# Patient Record
Sex: Male | Born: 1970
Health system: Southern US, Community
[De-identification: ages and names within clinical notes are randomized; demographics above are authoritative.]

## PROBLEM LIST (undated history)

## (undated) DIAGNOSIS — F419 Anxiety disorder, unspecified: Secondary | ICD-10-CM

## (undated) DIAGNOSIS — F32A Depression, unspecified: Secondary | ICD-10-CM

## (undated) DIAGNOSIS — F329 Major depressive disorder, single episode, unspecified: Secondary | ICD-10-CM

## (undated) DIAGNOSIS — E119 Type 2 diabetes mellitus without complications: Secondary | ICD-10-CM

## (undated) HISTORY — PX: NO PAST SURGERIES: SHX2092

---

## 2001-05-02 ENCOUNTER — Emergency Department (HOSPITAL_COMMUNITY): Admission: EM | Admit: 2001-05-02 | Discharge: 2001-05-02 | Payer: Self-pay | Admitting: Emergency Medicine

## 2002-03-24 ENCOUNTER — Emergency Department (HOSPITAL_COMMUNITY): Admission: EM | Admit: 2002-03-24 | Discharge: 2002-03-24 | Payer: Self-pay | Admitting: Emergency Medicine

## 2002-08-11 ENCOUNTER — Emergency Department (HOSPITAL_COMMUNITY): Admission: EM | Admit: 2002-08-11 | Discharge: 2002-08-11 | Payer: Self-pay | Admitting: Emergency Medicine

## 2002-08-14 ENCOUNTER — Emergency Department (HOSPITAL_COMMUNITY): Admission: EM | Admit: 2002-08-14 | Discharge: 2002-08-14 | Payer: Self-pay | Admitting: Emergency Medicine

## 2002-09-06 ENCOUNTER — Emergency Department (HOSPITAL_COMMUNITY): Admission: EM | Admit: 2002-09-06 | Discharge: 2002-09-06 | Payer: Self-pay | Admitting: Emergency Medicine

## 2002-09-06 ENCOUNTER — Emergency Department (HOSPITAL_COMMUNITY): Admission: EM | Admit: 2002-09-06 | Discharge: 2002-09-06 | Payer: Self-pay

## 2002-09-06 ENCOUNTER — Encounter: Payer: Self-pay | Admitting: Emergency Medicine

## 2002-09-15 ENCOUNTER — Emergency Department (HOSPITAL_COMMUNITY): Admission: EM | Admit: 2002-09-15 | Discharge: 2002-09-15 | Payer: Self-pay | Admitting: Emergency Medicine

## 2002-10-21 ENCOUNTER — Emergency Department (HOSPITAL_COMMUNITY): Admission: EM | Admit: 2002-10-21 | Discharge: 2002-10-21 | Payer: Self-pay | Admitting: Emergency Medicine

## 2002-11-21 ENCOUNTER — Emergency Department (HOSPITAL_COMMUNITY): Admission: EM | Admit: 2002-11-21 | Discharge: 2002-11-21 | Payer: Self-pay | Admitting: Emergency Medicine

## 2002-11-24 ENCOUNTER — Emergency Department (HOSPITAL_COMMUNITY): Admission: EM | Admit: 2002-11-24 | Discharge: 2002-11-24 | Payer: Self-pay | Admitting: Emergency Medicine

## 2002-11-26 ENCOUNTER — Emergency Department (HOSPITAL_COMMUNITY): Admission: EM | Admit: 2002-11-26 | Discharge: 2002-11-26 | Payer: Self-pay | Admitting: Emergency Medicine

## 2004-07-03 ENCOUNTER — Emergency Department (HOSPITAL_COMMUNITY): Admission: EM | Admit: 2004-07-03 | Discharge: 2004-07-03 | Payer: Self-pay | Admitting: Family Medicine

## 2007-01-27 ENCOUNTER — Emergency Department (HOSPITAL_COMMUNITY): Admission: EM | Admit: 2007-01-27 | Discharge: 2007-01-27 | Payer: Self-pay | Admitting: Family Medicine

## 2007-03-17 ENCOUNTER — Emergency Department (HOSPITAL_COMMUNITY): Admission: EM | Admit: 2007-03-17 | Discharge: 2007-03-17 | Payer: Self-pay | Admitting: Emergency Medicine

## 2007-03-18 ENCOUNTER — Emergency Department (HOSPITAL_COMMUNITY): Admission: EM | Admit: 2007-03-18 | Discharge: 2007-03-18 | Payer: Self-pay | Admitting: Emergency Medicine

## 2008-03-31 ENCOUNTER — Emergency Department (HOSPITAL_COMMUNITY): Admission: EM | Admit: 2008-03-31 | Discharge: 2008-03-31 | Payer: Self-pay | Admitting: Emergency Medicine

## 2010-09-24 ENCOUNTER — Inpatient Hospital Stay (INDEPENDENT_AMBULATORY_CARE_PROVIDER_SITE_OTHER)
Admission: RE | Admit: 2010-09-24 | Discharge: 2010-09-24 | Disposition: A | Discharge status: Home / Self Care | Payer: Self-pay | Source: Ambulatory Visit | Attending: Family Medicine | Admitting: Family Medicine

## 2010-09-24 DIAGNOSIS — K029 Dental caries, unspecified: Secondary | ICD-10-CM

## 2010-09-24 DIAGNOSIS — K089 Disorder of teeth and supporting structures, unspecified: Secondary | ICD-10-CM

## 2013-08-10 ENCOUNTER — Encounter (HOSPITAL_COMMUNITY): Payer: Self-pay | Admitting: Emergency Medicine

## 2013-08-10 ENCOUNTER — Emergency Department (HOSPITAL_COMMUNITY)
Admission: EM | Admit: 2013-08-10 | Discharge: 2013-08-10 | Disposition: A | Discharge status: Home / Self Care | Payer: BC Managed Care – PPO | Attending: Emergency Medicine | Admitting: Emergency Medicine

## 2013-08-10 DIAGNOSIS — R42 Dizziness and giddiness: Secondary | ICD-10-CM | POA: Insufficient documentation

## 2013-08-10 DIAGNOSIS — Z760 Encounter for issue of repeat prescription: Secondary | ICD-10-CM | POA: Insufficient documentation

## 2013-08-10 DIAGNOSIS — F411 Generalized anxiety disorder: Secondary | ICD-10-CM | POA: Insufficient documentation

## 2013-08-10 DIAGNOSIS — Z79899 Other long term (current) drug therapy: Secondary | ICD-10-CM | POA: Insufficient documentation

## 2013-08-10 DIAGNOSIS — F419 Anxiety disorder, unspecified: Secondary | ICD-10-CM

## 2013-08-10 DIAGNOSIS — R030 Elevated blood-pressure reading, without diagnosis of hypertension: Secondary | ICD-10-CM | POA: Insufficient documentation

## 2013-08-10 HISTORY — DX: Anxiety disorder, unspecified: F41.9

## 2013-08-10 MED ORDER — CLONAZEPAM 0.5 MG PO TABS: 0.5000 mg | ORAL_TABLET | Freq: Every day | ORAL | Status: DC

## 2013-08-10 NOTE — Discharge Instructions (Signed)
Take clonazepam as prescribed.  Do not drive within four hours of taking this medication (may cause drowsiness or confusion).   Follow up with your doctor as scheduled.  Return to the ER if there is a change from your typical symptoms or you develop worsening chest pain.

## 2013-08-10 NOTE — ED Notes (Signed)
PT ambulated with baseline gait; VSS; A&Ox3; no signs of distress; respirations even and unlabored; skin warm and dry; no questions upon discharge.  

## 2013-08-10 NOTE — ED Notes (Signed)
Pt in stating he had an anxiety attack tonight, states he has been out of his clonazepam 0.5mg  for about a month, he would like to get a refill of his medications since he is experiencing symptoms, states his appointment with his MD is 2/28.

## 2013-08-10 NOTE — ED Provider Notes (Signed)
CSN: [id number]     Arrival date & time 08/10/13  2100 History   First MD Initiated Contact with Patient 08/10/13 2114     Chief Complaint  Patient presents with  . Medication Refill     (Consider location/radiation/quality/duration/timing/severity/associated sxs/prior Treatment) HPI History provided by pt.    Pt requests a refill of his clonazepam.  This medication is prescribed regularly by his PCP, but he ran out about a month ago and next appointment on 08/18/13.  He experienced a panic attack at ~7pm today.  Sx included lightheadedness, chest tightness and bilateral hand tingling, all of which are typical.  He has had persistent lightheadedness since onset, but has otherwise been feeling well.  Has been drinking fluids and eating regularly.  No new medications.  Has otherwise not had any CP/SOB.  Denies hematochezia/melena or bleeding at any other site.  No RF for PE.  No PMH.  Past Medical History  Diagnosis Date  . Anxiety    History reviewed. No pertinent past surgical history. History reviewed. No pertinent family history. History  Substance Use Topics  . Smoking status: Never Smoker   . Smokeless tobacco: Not on file  . Alcohol Use: Not on file    Review of Systems  All other systems reviewed and are negative.      Allergies  Review of patient's allergies indicates no known allergies.  Home Medications   Current Outpatient Rx  Name  Route  Sig  Dispense  Refill  . clonazePAM (KLONOPIN) 0.5 MG tablet   Oral   Take 0.5 mg by mouth daily as needed for anxiety.         Marland Kitchen. venlafaxine (EFFEXOR) 75 MG tablet   Oral   Take 75 mg by mouth daily.         . clonazePAM (KLONOPIN) 0.5 MG tablet   Oral   Take 1 tablet (0.5 mg total) by mouth daily.   8 tablet   0    BP 155/92  Pulse 82  Temp(Src) 98.4 F (36.9 C) (Oral)  Resp 18  Ht 5\' 9"  (1.753 m)  Wt 200 lb (90.719 kg)  BMI 29.52 kg/m2  SpO2 99% Physical Exam  Nursing note and vitals  reviewed. Constitutional: He is oriented to person, place, and time. He appears well-developed and well-nourished. No distress.  HENT:  Head: Normocephalic and atraumatic.  Mouth/Throat: Oropharynx is clear and moist.  Eyes: Conjunctivae are normal.  Normal appearance  Neck: Normal range of motion.  Cardiovascular: Normal rate, regular rhythm and intact distal pulses.   Pulmonary/Chest: Effort normal and breath sounds normal. No respiratory distress.  Musculoskeletal: Normal range of motion.  No calf edema/ttp  Neurological: He is alert and oriented to person, place, and time.  Appears anxious  Skin: Skin is warm and dry. No rash noted.  Psychiatric: He has a normal mood and affect. His behavior is normal.    ED Course  Procedures (including critical care time) Labs Review Labs Reviewed - No data to display Imaging Review No results found.  EKG Interpretation   None       MDM   Final diagnoses:  Anxiety    43yo M presents w/ typical anxiety attack since 7pm today.  Sx improved but has persistent lightheadedness.  Ran out of his clonazepam 1 month ago and f/u scheduled w/ PCP on 08/18/13.  He requests refill of his medication.  Appears mildly anxious but exam otherwise unremarkable; slightly hypertensive, nml HR, no sign of  anemia, dehydration, DVT.  Per [place] Cont[place] Substance [place], pt is prescribed this medication by his PCP on a monthly basis, most recently in 05/2013.  I prescribed 8 tabs to get him through until his appointment. He is aware that his BP is elevated and he will notify his PCP. Return precautions discussed.    Otilio Miu, PA-C 08/10/13 2217

## 2013-08-11 NOTE — ED Provider Notes (Signed)
Medical screening examination/treatment/procedure(s) were performed by non-physician practitioner and as supervising physician I was immediately available for consultation/collaboration.  EKG Interpretation   None         Dagmar HaitWilliam Tinita Brooker, MD 08/11/13 2356

## 2013-10-29 ENCOUNTER — Emergency Department (HOSPITAL_COMMUNITY)
Admission: EM | Admit: 2013-10-29 | Discharge: 2013-10-29 | Disposition: A | Discharge status: Home / Self Care | Payer: BC Managed Care – PPO | Attending: Emergency Medicine | Admitting: Emergency Medicine

## 2013-10-29 ENCOUNTER — Emergency Department (HOSPITAL_COMMUNITY): Payer: BC Managed Care – PPO

## 2013-10-29 ENCOUNTER — Encounter (HOSPITAL_COMMUNITY): Payer: Self-pay | Admitting: Emergency Medicine

## 2013-10-29 DIAGNOSIS — F411 Generalized anxiety disorder: Secondary | ICD-10-CM | POA: Insufficient documentation

## 2013-10-29 DIAGNOSIS — Y92410 Unspecified street and highway as the place of occurrence of the external cause: Secondary | ICD-10-CM | POA: Insufficient documentation

## 2013-10-29 DIAGNOSIS — IMO0002 Reserved for concepts with insufficient information to code with codable children: Secondary | ICD-10-CM | POA: Insufficient documentation

## 2013-10-29 DIAGNOSIS — M2391 Unspecified internal derangement of right knee: Secondary | ICD-10-CM

## 2013-10-29 DIAGNOSIS — Y9389 Activity, other specified: Secondary | ICD-10-CM | POA: Insufficient documentation

## 2013-10-29 DIAGNOSIS — Z79899 Other long term (current) drug therapy: Secondary | ICD-10-CM | POA: Insufficient documentation

## 2013-10-29 MED ORDER — HYDROCODONE-ACETAMINOPHEN 5-325 MG PO TABS: 1.0000 | ORAL_TABLET | Freq: Once | ORAL | Status: DC

## 2013-10-29 MED ORDER — HYDROCODONE-ACETAMINOPHEN 5-325 MG PO TABS: 1.0000 | ORAL_TABLET | ORAL | Status: DC | PRN

## 2013-10-29 MED ORDER — NAPROXEN 500 MG PO TABS: 500.0000 mg | ORAL_TABLET | Freq: Two times a day (BID) | ORAL | Status: DC

## 2013-10-29 MED ORDER — HYDROCODONE-ACETAMINOPHEN 5-325 MG PO TABS
1.0000 | ORAL_TABLET | Freq: Once | ORAL | Status: AC
  Administered 2013-10-29: 1 via ORAL
  Filled 2013-10-29: qty 1
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
## 2013-10-29 NOTE — ED Notes (Signed)
Patient transported to X-ray 
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
## 2013-10-29 NOTE — Discharge Instructions (Signed)
Knee Effusion  Knee effusion means you have fluid in your knee. The knee may be more difficult to bend and move. HOME CARE  Use crutches or a brace as told by your doctor.  Put ice on the injured area.  Put ice in a plastic bag.  Place a towel between your skin and the bag.  Leave the ice on for 15-20 minutes, 03-04 times a day.  Raise (elevate) your knee as much as possible.  Only take medicine as told by your doctor.  You may need to do strengthening exercises. Ask your doctor.  Continue with your normal diet and activities as told by your doctor. GET HELP RIGHT AWAY IF:  You have more puffiness (swelling) in your knee.  You see redness, puffiness, or have more pain in your knee.  You have a temperature by mouth above 102 F (38.9 C).  You get a rash.  You have trouble breathing.  You have a reaction to any medicine you are taking.  You have a lot of pain when you move your knee. MAKE SURE YOU:  Understand these instructions.  Will watch your condition.  Will get help right away if you are not doing well or get worse. Document Released: 07/10/2010 Document Revised: 08/30/2011 Document Reviewed: 07/10/2010 Exit[employer] Patient Information 2014 [employer].  

## 2013-11-21 ENCOUNTER — Other Ambulatory Visit (HOSPITAL_COMMUNITY): Payer: Self-pay | Admitting: Orthopedic Surgery

## 2013-11-28 ENCOUNTER — Other Ambulatory Visit (HOSPITAL_COMMUNITY): Payer: Self-pay | Admitting: *Deleted

## 2013-11-28 NOTE — Pre-Procedure Instructions (Signed)
Joshua [name]  11/28/2013   Your procedure is scheduled on: Tuesday, December 04, 2013 at 10:39 AM.    Report to [facility] Entrance "A" Admitting Office at 8:40 AM.   Call this number if you have problems the morning of surgery: [phone]   Remember:   Do not eat food or drink liquids after midnight Monday, 12/03/13.   Take these medicines the morning of surgery with A SIP OF WATER: Hydrocodone - if needed, Hydroxyzine - if needed.  Do not use any Aspirin or NSAIDS (Ibuprofen, Aleve, etc) as of today.   Do not wear jewelry.  Do not wear lotions, powders, or cologne. You may wear deodorant.  Do not shave 48 hours prior to surgery. Men may shave face and neck.  Do not bring valuables to the hospital.  [facility] is not responsible                  for any belongings or valuables.               Contacts, dentures or bridgework may not be worn into surgery.  Leave suitcase in the car. After surgery it may be brought to your room.  For patients admitted to the hospital, discharge time is determined by your                treatment team.                               Special Instructions: [place] - Preparing for Surgery  Before surgery, you can play an important role.  Because skin is not sterile, your skin needs to be as free of germs as possible.  You can reduce the number of germs on you skin by washing with CHG (chlorahexidine gluconate) soap before surgery.  CHG is an antiseptic cleaner which kills germs and bonds with the skin to continue killing germs even after washing.  Please DO NOT use if you have an allergy to CHG or antibacterial soaps.  If your skin becomes reddened/irritated stop using the CHG and inform your nurse when you arrive at Short Stay.  Do not shave (including legs and underarms) for at least 48 hours prior to the first CHG shower.  You may shave your face.  Please follow these instructions carefully:   1.  Shower with CHG Soap the night before  surgery and the                                morning of Surgery.  2.  If you choose to wash your hair, wash your hair first as usual with your       normal shampoo.  3.  After you shampoo, rinse your hair and body thoroughly to remove the                      Shampoo.  4.  Use CHG as you would any other liquid soap.  You can apply chg directly       to the skin and wash gently with scrungie or a clean washcloth.  5.  Apply the CHG Soap to your body ONLY FROM THE NECK DOWN.        Do not use on open wounds or open sores.  Avoid contact with your eyes, ears, mouth and genitals (private parts).  Wash genitals (private parts) with your normal soap.  6.  Wash thoroughly, paying special attention to the area where your surgery        will be performed.  7.  Thoroughly rinse your body with warm water from the neck down.  8.  DO NOT shower/wash with your normal soap after using and rinsing off       the CHG Soap.  9.  Pat yourself dry with a clean towel.            10.  Wear clean pajamas.            11.  Place clean sheets on your bed the night of your first shower and do not        sleep with pets.  Day of Surgery  Do not apply any lotions the morning of surgery.  Please wear clean clothes to the hospital/surgery center.     Please read over the following fact sheets that you were given: Pain Booklet, Coughing and Deep Breathing and Surgical Site Infection Prevention

## 2013-11-29 ENCOUNTER — Encounter (HOSPITAL_COMMUNITY)
Admission: RE | Admit: 2013-11-29 | Discharge: 2013-11-29 | Disposition: A | Discharge status: Home / Self Care | Payer: BC Managed Care – PPO | Source: Ambulatory Visit | Attending: Orthopedic Surgery | Admitting: Orthopedic Surgery

## 2013-11-29 ENCOUNTER — Encounter (HOSPITAL_COMMUNITY): Payer: Self-pay

## 2013-11-29 DIAGNOSIS — Z01812 Encounter for preprocedural laboratory examination: Secondary | ICD-10-CM | POA: Insufficient documentation

## 2013-11-29 DIAGNOSIS — Z01818 Encounter for other preprocedural examination: Secondary | ICD-10-CM | POA: Insufficient documentation

## 2013-11-29 HISTORY — DX: Depression, unspecified: F32.A

## 2013-11-29 HISTORY — DX: Major depressive disorder, single episode, unspecified: F32.9

## 2013-11-29 LAB — CBC
HEMATOCRIT: 46.4 % (ref 39.0–52.0)
Hemoglobin: 15.9 g/dL (ref 13.0–17.0)
MCH: 30.9 pg (ref 26.0–34.0)
MCHC: 34.3 g/dL (ref 30.0–36.0)
MCV: 90.1 fL (ref 78.0–100.0)
PLATELETS: 240 10*3/uL (ref 150–400)
RBC: 5.15 MIL/uL (ref 4.22–5.81)
RDW: 13.2 % (ref 11.5–15.5)
WBC: 10.2 10*3/uL (ref 4.0–10.5)

## 2013-11-29 LAB — BASIC METABOLIC PANEL
BUN: 12 mg/dL (ref 6–23)
CALCIUM: 10.1 mg/dL (ref 8.4–10.5)
CHLORIDE: 101 meq/L (ref 96–112)
CO2: 26 meq/L (ref 19–32)
CREATININE: 0.91 mg/dL (ref 0.50–1.35)
GFR calc Af Amer: >90 mL/min (ref >90)
GFR calc non Af Amer: >90 mL/min (ref >90)
Glucose, Bld: 112 mg/dL — ABNORMAL HIGH (ref 70–99)
Potassium: 4.2 mEq/L (ref 3.7–5.3)
Sodium: 140 mEq/L (ref 137–147)

## 2013-11-29 NOTE — Progress Notes (Signed)
Pt does not have a PCP. Goes to [place] for his mental health care.

## 2013-12-03 MED ORDER — CHLORHEXIDINE GLUCONATE 4 % EX LIQD
60.0000 mL | Freq: Once | CUTANEOUS | Status: DC
  Filled 2013-12-03: qty 60

## 2013-12-03 MED ORDER — CEFAZOLIN SODIUM-DEXTROSE 2-3 GM-% IV SOLR
2.0000 g | INTRAVENOUS | Status: AC
  Administered 2013-12-04: 2 g via INTRAVENOUS
  Filled 2013-12-03: qty 50

## 2013-12-04 ENCOUNTER — Encounter (HOSPITAL_COMMUNITY): Payer: Self-pay | Admitting: *Deleted

## 2013-12-04 ENCOUNTER — Encounter (HOSPITAL_COMMUNITY): Payer: BC Managed Care – PPO | Admitting: Anesthesiology

## 2013-12-04 ENCOUNTER — Encounter (HOSPITAL_COMMUNITY)
Admission: RE | Disposition: A | Discharge status: Home / Self Care | Payer: Self-pay | Source: Ambulatory Visit | Attending: Orthopedic Surgery

## 2013-12-04 ENCOUNTER — Ambulatory Visit (HOSPITAL_COMMUNITY): Payer: BC Managed Care – PPO | Admitting: Anesthesiology

## 2013-12-04 ENCOUNTER — Inpatient Hospital Stay (HOSPITAL_COMMUNITY)
Admission: RE | Admit: 2013-12-04 | Discharge: 2013-12-05 | DRG: 489 | Disposition: A | Discharge status: Home / Self Care | Payer: BC Managed Care – PPO | Source: Ambulatory Visit | Attending: Orthopedic Surgery | Admitting: Orthopedic Surgery

## 2013-12-04 DIAGNOSIS — F3289 Other specified depressive episodes: Secondary | ICD-10-CM | POA: Diagnosis present

## 2013-12-04 DIAGNOSIS — F411 Generalized anxiety disorder: Secondary | ICD-10-CM | POA: Diagnosis present

## 2013-12-04 DIAGNOSIS — S83289A Other tear of lateral meniscus, current injury, unspecified knee, initial encounter: Secondary | ICD-10-CM | POA: Diagnosis present

## 2013-12-04 DIAGNOSIS — S83519A Sprain of anterior cruciate ligament of unspecified knee, initial encounter: Secondary | ICD-10-CM | POA: Diagnosis present

## 2013-12-04 DIAGNOSIS — F329 Major depressive disorder, single episode, unspecified: Secondary | ICD-10-CM | POA: Diagnosis present

## 2013-12-04 DIAGNOSIS — X58XXXA Exposure to other specified factors, initial encounter: Secondary | ICD-10-CM | POA: Diagnosis present

## 2013-12-04 DIAGNOSIS — F17200 Nicotine dependence, unspecified, uncomplicated: Secondary | ICD-10-CM | POA: Diagnosis present

## 2013-12-04 DIAGNOSIS — G4730 Sleep apnea, unspecified: Secondary | ICD-10-CM | POA: Diagnosis present

## 2013-12-04 DIAGNOSIS — S83509A Sprain of unspecified cruciate ligament of unspecified knee, initial encounter: Principal | ICD-10-CM | POA: Diagnosis present

## 2013-12-04 HISTORY — PX: ANTERIOR CRUCIATE LIGAMENT REPAIR: SHX115

## 2013-12-04 SURGERY — RECONSTRUCTION, KNEE, ACL, USING HAMSTRING GRAFT
Anesthesia: Regional | Site: Knee | Laterality: Right

## 2013-12-04 MED ORDER — OXYCODONE HCL 5 MG PO TABS
ORAL_TABLET | ORAL | Status: AC
  Administered 2013-12-04: 5 mg via ORAL
  Filled 2013-12-04: qty 1

## 2013-12-04 MED ORDER — ACETAMINOPHEN 325 MG PO TABS: 325.0000 mg | ORAL_TABLET | ORAL | Status: DC | PRN

## 2013-12-04 MED ORDER — METHOCARBAMOL 500 MG PO TABS
ORAL_TABLET | ORAL | Status: AC
  Administered 2013-12-04: 500 mg via ORAL
  Filled 2013-12-04: qty 1

## 2013-12-04 MED ORDER — ASPIRIN 325 MG PO TABS
325.0000 mg | ORAL_TABLET | Freq: Every day | ORAL | Status: DC
  Administered 2013-12-04 – 2013-12-05 (×2): 325 mg via ORAL
  Filled 2013-12-04 (×2): qty 1

## 2013-12-04 MED ORDER — SODIUM CHLORIDE 0.9 % IR SOLN
Status: DC | PRN
  Administered 2013-12-04: 3000 mL
  Administered 2013-12-04: 1000 mL
  Administered 2013-12-04 (×4): 3000 mL

## 2013-12-04 MED ORDER — ACETAMINOPHEN 160 MG/5ML PO SOLN
325.0000 mg | ORAL | Status: DC | PRN
  Filled 2013-12-04: qty 20.3

## 2013-12-04 MED ORDER — CEFAZOLIN SODIUM-DEXTROSE 2-3 GM-% IV SOLR: 2.0000 g | Freq: Three times a day (TID) | INTRAVENOUS | Status: DC

## 2013-12-04 MED ORDER — ONDANSETRON HCL 4 MG/2ML IJ SOLN: 4.0000 mg | Freq: Four times a day (QID) | INTRAMUSCULAR | Status: DC | PRN

## 2013-12-04 MED ORDER — MIDAZOLAM HCL 2 MG/2ML IJ SOLN
INTRAMUSCULAR | Status: AC
  Filled 2013-12-04: qty 2

## 2013-12-04 MED ORDER — MIDAZOLAM HCL 2 MG/2ML IJ SOLN
INTRAMUSCULAR | Status: AC
  Administered 2013-12-04: 2 mg
  Filled 2013-12-04: qty 2

## 2013-12-04 MED ORDER — ONDANSETRON HCL 4 MG/2ML IJ SOLN
INTRAMUSCULAR | Status: DC | PRN
  Administered 2013-12-04: 4 mg via INTRAVENOUS

## 2013-12-04 MED ORDER — METHOCARBAMOL 1000 MG/10ML IJ SOLN
500.0000 mg | Freq: Four times a day (QID) | INTRAMUSCULAR | Status: DC | PRN
  Filled 2013-12-04: qty 5

## 2013-12-04 MED ORDER — OXYCODONE HCL 5 MG PO TABS
5.0000 mg | ORAL_TABLET | Freq: Once | ORAL | Status: AC | PRN
  Administered 2013-12-04: 5 mg via ORAL

## 2013-12-04 MED ORDER — MIDAZOLAM HCL 5 MG/ML IJ SOLN: 2.0000 mg | Freq: Once | INTRAMUSCULAR | Status: DC

## 2013-12-04 MED ORDER — CHLORHEXIDINE GLUCONATE 4 % EX LIQD
60.0000 mL | Freq: Once | CUTANEOUS | Status: DC
  Filled 2013-12-04: qty 60

## 2013-12-04 MED ORDER — ONDANSETRON HCL 4 MG PO TABS: 4.0000 mg | ORAL_TABLET | Freq: Four times a day (QID) | ORAL | Status: DC | PRN

## 2013-12-04 MED ORDER — PROPOFOL 10 MG/ML IV BOLUS
INTRAVENOUS | Status: DC | PRN
  Administered 2013-12-04: 180 mg via INTRAVENOUS

## 2013-12-04 MED ORDER — LORAZEPAM 2 MG/ML IJ SOLN: 0.5000 mg | Freq: Once | INTRAMUSCULAR | Status: DC | PRN

## 2013-12-04 MED ORDER — OXYCODONE HCL 5 MG/5ML PO SOLN: 5.0000 mg | Freq: Once | ORAL | Status: AC | PRN

## 2013-12-04 MED ORDER — NEOSTIGMINE METHYLSULFATE 10 MG/10ML IV SOLN
INTRAVENOUS | Status: DC | PRN
  Administered 2013-12-04: 4 mg via INTRAVENOUS

## 2013-12-04 MED ORDER — FENTANYL CITRATE 0.05 MG/ML IJ SOLN
INTRAMUSCULAR | Status: AC
  Filled 2013-12-04: qty 5

## 2013-12-04 MED ORDER — PROPOFOL 10 MG/ML IV BOLUS
INTRAVENOUS | Status: AC
Start: 2013-12-04 — End: 2013-12-04
  Filled 2013-12-04: qty 20

## 2013-12-04 MED ORDER — FENTANYL CITRATE 0.05 MG/ML IJ SOLN
INTRAMUSCULAR | Status: DC | PRN
  Administered 2013-12-04: 50 ug via INTRAVENOUS
  Administered 2013-12-04: 100 ug via INTRAVENOUS
  Administered 2013-12-04 (×2): 50 ug via INTRAVENOUS

## 2013-12-04 MED ORDER — ONDANSETRON HCL 4 MG/2ML IJ SOLN
4.0000 mg | Freq: Four times a day (QID) | INTRAMUSCULAR | Status: DC | PRN
Start: 2013-12-04 — End: 2013-12-04

## 2013-12-04 MED ORDER — MORPHINE SULFATE 2 MG/ML IJ SOLN
1.0000 mg | INTRAMUSCULAR | Status: DC | PRN
  Administered 2013-12-04: 1 mg via INTRAVENOUS
  Filled 2013-12-04: qty 1

## 2013-12-04 MED ORDER — BUPIVACAINE HCL (PF) 0.25 % IJ SOLN
INTRAMUSCULAR | Status: AC
  Filled 2013-12-04: qty 30

## 2013-12-04 MED ORDER — ASPIRIN 325 MG PO TABS: 325.0000 mg | ORAL_TABLET | Freq: Every day | ORAL | Status: DC

## 2013-12-04 MED ORDER — LACTATED RINGERS IV SOLN
INTRAVENOUS | Status: DC | PRN
  Administered 2013-12-04: 12:00:00 via INTRAVENOUS

## 2013-12-04 MED ORDER — MORPHINE SULFATE 2 MG/ML IJ SOLN: 1.0000 mg | INTRAMUSCULAR | Status: DC | PRN

## 2013-12-04 MED ORDER — KETOROLAC TROMETHAMINE 30 MG/ML IJ SOLN
30.0000 mg | Freq: Four times a day (QID) | INTRAMUSCULAR | Status: DC
  Administered 2013-12-05 (×2): 30 mg via INTRAVENOUS
  Filled 2013-12-04 (×6): qty 1

## 2013-12-04 MED ORDER — ROCURONIUM BROMIDE 50 MG/5ML IV SOLN
INTRAVENOUS | Status: AC
  Filled 2013-12-04: qty 1

## 2013-12-04 MED ORDER — HYDROMORPHONE HCL PF 1 MG/ML IJ SOLN
0.2500 mg | INTRAMUSCULAR | Status: DC | PRN
  Administered 2013-12-04 (×4): 0.5 mg via INTRAVENOUS

## 2013-12-04 MED ORDER — LIDOCAINE HCL (CARDIAC) 20 MG/ML IV SOLN
INTRAVENOUS | Status: AC
  Filled 2013-12-04: qty 5

## 2013-12-04 MED ORDER — MORPHINE SULFATE 4 MG/ML IJ SOLN
INTRAMUSCULAR | Status: AC
  Filled 2013-12-04: qty 1

## 2013-12-04 MED ORDER — METOCLOPRAMIDE HCL 10 MG PO TABS: 5.0000 mg | ORAL_TABLET | Freq: Three times a day (TID) | ORAL | Status: DC | PRN

## 2013-12-04 MED ORDER — GLYCOPYRROLATE 0.2 MG/ML IJ SOLN
INTRAMUSCULAR | Status: DC | PRN
  Administered 2013-12-04: .7 mg via INTRAVENOUS

## 2013-12-04 MED ORDER — CEFAZOLIN SODIUM-DEXTROSE 2-3 GM-% IV SOLR
2.0000 g | Freq: Three times a day (TID) | INTRAVENOUS | Status: AC
  Administered 2013-12-04 – 2013-12-05 (×2): 2 g via INTRAVENOUS
  Filled 2013-12-04 (×2): qty 50

## 2013-12-04 MED ORDER — POTASSIUM CHLORIDE IN NACL 20-0.9 MEQ/L-% IV SOLN
INTRAVENOUS | Status: DC
  Administered 2013-12-05: 04:00:00 via INTRAVENOUS
  Filled 2013-12-04 (×2): qty 1000

## 2013-12-04 MED ORDER — METOCLOPRAMIDE HCL 5 MG/ML IJ SOLN: 5.0000 mg | Freq: Three times a day (TID) | INTRAMUSCULAR | Status: DC | PRN

## 2013-12-04 MED ORDER — HYDROMORPHONE HCL PF 1 MG/ML IJ SOLN
INTRAMUSCULAR | Status: AC
  Filled 2013-12-04: qty 1

## 2013-12-04 MED ORDER — CLONIDINE HCL (ANALGESIA) 100 MCG/ML EP SOLN
150.0000 ug | EPIDURAL | Status: DC
  Filled 2013-12-04: qty 1.5

## 2013-12-04 MED ORDER — METHOCARBAMOL 500 MG PO TABS
500.0000 mg | ORAL_TABLET | Freq: Four times a day (QID) | ORAL | Status: DC | PRN
  Administered 2013-12-05 (×2): 500 mg via ORAL
  Filled 2013-12-04 (×2): qty 1

## 2013-12-04 MED ORDER — LIDOCAINE HCL (CARDIAC) 20 MG/ML IV SOLN
INTRAVENOUS | Status: DC | PRN
  Administered 2013-12-04: 40 mg via INTRAVENOUS

## 2013-12-04 MED ORDER — KETOROLAC TROMETHAMINE 30 MG/ML IJ SOLN: 15.0000 mg | Freq: Once | INTRAMUSCULAR | Status: DC | PRN

## 2013-12-04 MED ORDER — POTASSIUM CHLORIDE IN NACL 20-0.9 MEQ/L-% IV SOLN
INTRAVENOUS | Status: DC
  Filled 2013-12-04 (×2): qty 1000

## 2013-12-04 MED ORDER — FENTANYL CITRATE 0.05 MG/ML IJ SOLN
100.0000 ug | Freq: Once | INTRAMUSCULAR | Status: AC
  Administered 2013-12-04: 100 ug via INTRAVENOUS

## 2013-12-04 MED ORDER — OXYCODONE HCL 5 MG PO TABS
5.0000 mg | ORAL_TABLET | ORAL | Status: DC | PRN
  Administered 2013-12-05: 5 mg via ORAL
  Administered 2013-12-05: 10 mg via ORAL
  Filled 2013-12-04: qty 2
  Filled 2013-12-04: qty 1

## 2013-12-04 MED ORDER — PROMETHAZINE HCL 25 MG/ML IJ SOLN: 6.2500 mg | INTRAMUSCULAR | Status: DC | PRN

## 2013-12-04 MED ORDER — ROCURONIUM BROMIDE 100 MG/10ML IV SOLN
INTRAVENOUS | Status: DC | PRN
  Administered 2013-12-04: 50 mg via INTRAVENOUS

## 2013-12-04 MED ORDER — OXYCODONE HCL 5 MG PO TABS: 5.0000 mg | ORAL_TABLET | ORAL | Status: DC | PRN

## 2013-12-04 MED ORDER — ROPIVACAINE HCL 5 MG/ML IJ SOLN
INTRAMUSCULAR | Status: DC | PRN
  Administered 2013-12-04: 20 mL via PERINEURAL

## 2013-12-04 MED ORDER — KETOROLAC TROMETHAMINE 15 MG/ML IJ SOLN: 15.0000 mg | Freq: Four times a day (QID) | INTRAMUSCULAR | Status: DC

## 2013-12-04 MED ORDER — KETOROLAC TROMETHAMINE 15 MG/ML IJ SOLN
INTRAMUSCULAR | Status: AC
  Administered 2013-12-04: 15 mg
  Filled 2013-12-04: qty 1

## 2013-12-04 MED ORDER — LACTATED RINGERS IV SOLN
INTRAVENOUS | Status: DC
  Administered 2013-12-04: 09:00:00 via INTRAVENOUS

## 2013-12-04 MED ORDER — FENTANYL CITRATE 0.05 MG/ML IJ SOLN
INTRAMUSCULAR | Status: AC
  Filled 2013-12-04: qty 2

## 2013-12-04 MED ORDER — HYDROMORPHONE HCL PF 1 MG/ML IJ SOLN
INTRAMUSCULAR | Status: AC
  Administered 2013-12-04: 0.5 mg via INTRAVENOUS
  Filled 2013-12-04: qty 1

## 2013-12-04 MED ORDER — METHOCARBAMOL 500 MG PO TABS
500.0000 mg | ORAL_TABLET | Freq: Four times a day (QID) | ORAL | Status: DC | PRN
  Administered 2013-12-04: 500 mg via ORAL

## 2013-12-04 SURGICAL SUPPLY — 87 items
ANCHOR BUTTON TIGHTROPE ACL RT (Orthopedic Implant) ×4 IMPLANT
BANDAGE ELASTIC 6 VELCRO ST LF (GAUZE/BANDAGES/DRESSINGS) ×2 IMPLANT
BANDAGE ESMARK 6X9 LF (GAUZE/BANDAGES/DRESSINGS) ×1 IMPLANT
BENZOIN TINCTURE PRP APPL 2/3 (GAUZE/BANDAGES/DRESSINGS) ×2 IMPLANT
BIT DRILL STEP 3.5 (DRILL) ×1 IMPLANT
BLADE CUDA 5.5 (BLADE) IMPLANT
BLADE GREAT WHITE 4.2 (BLADE) ×2 IMPLANT
BLADE SURG 10 STRL SS (BLADE) ×2 IMPLANT
BLADE SURG 15 STRL LF DISP TIS (BLADE) ×2 IMPLANT
BLADE SURG 15 STRL SS (BLADE) ×2
BNDG CMPR MED 15X6 ELC VLCR LF (GAUZE/BANDAGES/DRESSINGS) ×1
BNDG ELASTIC 6X15 VLCR STRL LF (GAUZE/BANDAGES/DRESSINGS) ×2 IMPLANT
BNDG ESMARK 6X9 LF (GAUZE/BANDAGES/DRESSINGS) ×2
BONE MATRIX DEMINERALIZED 1CC (Bone Implant) ×4 IMPLANT
BUR OVAL 6.0 (BURR) ×2 IMPLANT
BUTTON EXT TIGHTROPE 5X20 (Orthopedic Implant) ×2 IMPLANT
COVER SURGICAL LIGHT HANDLE (MISCELLANEOUS) ×2 IMPLANT
CUFF TOURNIQUET SINGLE 34IN LL (TOURNIQUET CUFF) IMPLANT
CUFF TOURNIQUET SINGLE 44IN (TOURNIQUET CUFF) IMPLANT
CUTTER FLIP II 9.5MM (INSTRUMENTS) ×2 IMPLANT
DECANTER SPIKE VIAL GLASS SM (MISCELLANEOUS) ×2 IMPLANT
DRAPE ARTHROSCOPY W/POUCH 114 (DRAPES) ×2 IMPLANT
DRAPE INCISE IOBAN 66X45 STRL (DRAPES) ×2 IMPLANT
DRAPE U-SHAPE 47X51 STRL (DRAPES) ×2 IMPLANT
DRILL STEP 3.5 (DRILL) ×2
DRSG PAD ABDOMINAL 8X10 ST (GAUZE/BANDAGES/DRESSINGS) ×2 IMPLANT
ELECT REM PT RETURN 9FT ADLT (ELECTROSURGICAL) ×2
ELECTRODE REM PT RTRN 9FT ADLT (ELECTROSURGICAL) ×1 IMPLANT
EVACUATOR 1/8 PVC DRAIN (DRAIN) IMPLANT
FIBERSTICK 2 (SUTURE) ×2 IMPLANT
GAUZE XEROFORM 1X8 LF (GAUZE/BANDAGES/DRESSINGS) ×4 IMPLANT
GLOVE BIOGEL PI IND STRL 7.5 (GLOVE) ×1 IMPLANT
GLOVE BIOGEL PI IND STRL 8 (GLOVE) ×1 IMPLANT
GLOVE BIOGEL PI INDICATOR 7.5 (GLOVE) ×1
GLOVE BIOGEL PI INDICATOR 8 (GLOVE) ×1
GLOVE ECLIPSE 7.0 STRL STRAW (GLOVE) ×2 IMPLANT
GLOVE SURG ORTHO 8.0 STRL STRW (GLOVE) ×2 IMPLANT
GOWN STRL REUS W/ TWL LRG LVL3 (GOWN DISPOSABLE) ×4 IMPLANT
GOWN STRL REUS W/ TWL XL LVL3 (GOWN DISPOSABLE) ×1 IMPLANT
GOWN STRL REUS W/TWL LRG LVL3 (GOWN DISPOSABLE) ×4
GOWN STRL REUS W/TWL XL LVL3 (GOWN DISPOSABLE) ×2
IMMOBILIZER KNEE 24 THIGH 36 (MISCELLANEOUS) ×1 IMPLANT
IMMOBILIZER KNEE 24 UNIV (MISCELLANEOUS) ×2
KIT BASIN OR (CUSTOM PROCEDURE TRAY) ×2 IMPLANT
KIT BIOCARTILAGE DEL W/SYRINGE (KITS) ×2 IMPLANT
KIT ROOM TURNOVER OR (KITS) ×2 IMPLANT
MANIFOLD NEPTUNE II (INSTRUMENTS) ×2 IMPLANT
NEEDLE 18GX1X1/2 (RX/OR ONLY) (NEEDLE) ×2 IMPLANT
NS IRRIG 1000ML POUR BTL (IV SOLUTION) ×2 IMPLANT
PACK ARTHROSCOPY DSU (CUSTOM PROCEDURE TRAY) ×2 IMPLANT
PAD ARMBOARD 7.5X6 YLW CONV (MISCELLANEOUS) ×4 IMPLANT
PAD CAST 4YDX4 CTTN HI CHSV (CAST SUPPLIES) ×1 IMPLANT
PADDING CAST COTTON 4X4 STRL (CAST SUPPLIES) ×1
PADDING CAST COTTON 6X4 STRL (CAST SUPPLIES) ×2 IMPLANT
PENCIL BUTTON HOLSTER BLD 10FT (ELECTRODE) ×2 IMPLANT
REAMER C 10MM (INSTRUMENTS) IMPLANT
SET ARTHROSCOPY TUBING (MISCELLANEOUS) ×2
SET ARTHROSCOPY TUBING LN (MISCELLANEOUS) ×1 IMPLANT
SPONGE GAUZE 4X4 12PLY (GAUZE/BANDAGES/DRESSINGS) ×2 IMPLANT
SPONGE LAP 4X18 X RAY DECT (DISPOSABLE) ×4 IMPLANT
SPONGE SCRUB IODOPHOR (GAUZE/BANDAGES/DRESSINGS) ×2 IMPLANT
STRIP CLOSURE SKIN 1/2X4 (GAUZE/BANDAGES/DRESSINGS) ×4 IMPLANT
SUCTION FRAZIER TIP 10 FR DISP (SUCTIONS) ×2 IMPLANT
SUT 2 FIBERLOOP 20 STRT BLUE (SUTURE) ×2
SUT ETHILON 3 0 PS 1 (SUTURE) ×4 IMPLANT
SUT FIBERWIRE #2 38 T-5 BLUE (SUTURE) ×2
SUT MENISCAL KIT (KITS) IMPLANT
SUT PROLENE 3 0 PS 2 (SUTURE) ×2 IMPLANT
SUT VIC AB 0 CT1 27 (SUTURE) ×6
SUT VIC AB 0 CT1 27XBRD ANBCTR (SUTURE) ×3 IMPLANT
SUT VIC AB 2-0 CT1 27 (SUTURE) ×3
SUT VIC AB 2-0 CT1 TAPERPNT 27 (SUTURE) ×3 IMPLANT
SUT VICRYL 0 TIES 12 18 (SUTURE) IMPLANT
SUTURE 2 FIBERLOOP 20 STRT BLU (SUTURE) ×1 IMPLANT
SUTURE FIBERWR #2 38 T-5 BLUE (SUTURE) ×1 IMPLANT
SUTURE TIGERSTICK 2 TIGERWIR 2 (MISCELLANEOUS) ×2 IMPLANT
SYR 30ML LL (SYRINGE) ×2 IMPLANT
SYR 30ML SLIP (SYRINGE) ×2 IMPLANT
SYR BULB IRRIGATION 50ML (SYRINGE) ×2 IMPLANT
SYR TB 1ML LUER SLIP (SYRINGE) ×2 IMPLANT
TIGERSTICK 2 TIGERWIRE 2 (MISCELLANEOUS) ×4
TOWEL OR 17X24 6PK STRL BLUE (TOWEL DISPOSABLE) ×2 IMPLANT
TOWEL OR 17X26 10 PK STRL BLUE (TOWEL DISPOSABLE) ×2 IMPLANT
UNDERPAD 30X30 INCONTINENT (UNDERPADS AND DIAPERS) ×2 IMPLANT
WAND HAND CNTRL MULTIVAC 90 (MISCELLANEOUS) ×2 IMPLANT
WATER STERILE IRR 1000ML POUR (IV SOLUTION) ×2 IMPLANT
WRAP KNEE MAXI GEL POST OP (GAUZE/BANDAGES/DRESSINGS) ×2 IMPLANT

## 2013-12-04 NOTE — Transfer of Care (Signed)
Immediate Anesthesia Transfer of Care Note  Patient: Joshua [name]  Procedure(s) Performed: Procedure(s): RECONSTRUCTION ANTERIOR CRUCIATE LIGAMENT (ACL) WITH HAMSTRING GRAFT, PARTIAL LATERAL MENISCECTOMY, POSSIBLE LCL/PLC RECONSTRUCTION. (Right)  Patient Location: PACU  Anesthesia Type:GA combined with regional for post-op pain  Level of Consciousness: awake, alert  and oriented  Airway & Oxygen Therapy: Patient Spontanous Breathing and Patient connected to nasal cannula oxygen  Post-op Assessment: Report given to PACU RN and Post -op Vital signs reviewed and stable  Post vital signs: Reviewed and stable  Complications: No apparent anesthesia complications

## 2013-12-04 NOTE — Brief Op Note (Signed)
12/04/2013  5:04 PM  PATIENT:  Joshua [name]  43 y.o. male  PRE-OPERATIVE DIAGNOSIS:  RIGHT KNEE ACL TEAR, LATERAL MENISCAL TEAR  POST-OPERATIVE DIAGNOSIS:  RIGHT KNEE ACL TEAR, LATERAL MENISCAL TEAR  PROCEDURE:  Procedure(s): RECONSTRUCTION ANTERIOR CRUCIATE LIGAMENT (ACL) WITH HAMSTRING GRAFT, PARTIAL LATERAL MENISCECTOMY,   SURGEON:  Surgeon(s): Cammy CopaGregory Scott Dean, MD  ASSISTANT: s vernon pa  ANESTHESIA:   general  EBL: 10 ml       BLOOD ADMINISTERED: none  DRAINS: none   LOCAL MEDICATIONS USED:  none  SPECIMEN:  No Specimen  COUNTS:  YES  TOURNIQUET:   Total Tourniquet Time Documented: Thigh (Right) - 85 minutes Total: Thigh (Right) - 85 minutes   DICTATION: .Other Dictation: Dictation Number [id number]  PLAN OF CARE: Admit for overnight observation  PATIENT DISPOSITION:  PACU - hemodynamically stable

## 2013-12-04 NOTE — Anesthesia Preprocedure Evaluation (Addendum)
Anesthesia Evaluation  Patient identified by MRN, date of birth, ID band Patient awake    Reviewed: Allergy & Precautions, H&P , NPO status , Patient's Chart, lab work & pertinent test results  History of Anesthesia Complications Negative for: history of anesthetic complications  Airway Mallampati: II TM Distance: >3 FB Neck ROM: Full    Dental  (+) Teeth Intact, Dental Advisory Given   Pulmonary neg sleep apnea, neg COPDCurrent Smoker,  breath sounds clear to auscultation        Cardiovascular negative cardio ROS  Rhythm:Regular     Neuro/Psych PSYCHIATRIC DISORDERS Anxiety Depression negative neurological ROS     GI/Hepatic negative GI ROS, Neg liver ROS,   Endo/Other  negative endocrine ROS  Renal/GU negative Renal ROS     Musculoskeletal   Abdominal   Peds  Hematology negative hematology ROS (+)   Anesthesia Other Findings   Reproductive/Obstetrics                          Anesthesia Physical Anesthesia Plan  ASA: II  Anesthesia Plan: General and Regional   Post-op Pain Management:    Induction: Intravenous  Airway Management Planned: LMA and Oral ETT  Additional Equipment: None  Intra-op Plan:   Post-operative Plan: Extubation in OR  Informed Consent: I have reviewed the patients History and Physical, chart, labs and discussed the procedure including the risks, benefits and alternatives for the proposed anesthesia with the patient or authorized representative who has indicated his/her understanding and acceptance.   Dental advisory given  Plan Discussed with: CRNA and Surgeon  Anesthesia Plan Comments:         Anesthesia Quick Evaluation

## 2013-12-04 NOTE — Anesthesia Postprocedure Evaluation (Signed)
  Anesthesia Post-op Note  Patient: Danley Dankerobert E Wimbish  Procedure(s) Performed: Procedure(s): RECONSTRUCTION ANTERIOR CRUCIATE LIGAMENT (ACL) WITH HAMSTRING GRAFT, PARTIAL LATERAL MENISCECTOMY, POSSIBLE LCL/PLC RECONSTRUCTION. (Right)  Patient Location: PACU  Anesthesia Type:General and GA combined with regional for post-op pain  Level of Consciousness: awake, alert  and oriented  Airway and Oxygen Therapy: Patient Spontanous Breathing  Post-op Pain: none  Post-op Assessment: Post-op Vital signs reviewed, Patient's Cardiovascular Status Stable, Respiratory Function Stable, Patent Airway and Pain level controlled  Post-op Vital Signs: stable  Last Vitals:  Filed Vitals:   12/04/13 1808  BP: 134/82  Pulse: 57  Temp: 36.3 C  Resp: 18    Complications: No apparent anesthesia complications

## 2013-12-04 NOTE — Progress Notes (Signed)
Orthopedic Tech Progress Note Patient Details:  Joshua DankerRobert E [name] 08/22/1970 [id number]  CPM Right Knee CPM Right Knee: On Right Knee Flexion (Degrees): 45 Right Knee Extension (Degrees): 0   [name], [name] 12/04/2013, 6:37 PM

## 2013-12-04 NOTE — H&P (Signed)
Joshua [name] is an 43 y.o. male.   Chief Complaint: Right knee pain and instability* HPI: Joshua MaduroRobert is a 43 year old patient who injured his right knee several weeks ago he presents with instability pain and swelling. MRI scan consistent with anterior cruciate ligament tear lateral meniscal tear possible fibular collateral ligament strain. No family history of DVT or pulmonary embolism he does report symptomatic instability with activities of daily living.  Past Medical History  Diagnosis Date  . Depression     has been on Effexor, not taking at this time  . Anxiety     takes Hydroxyzine    Past Surgical History  Procedure Laterality Date  . No past surgeries      History reviewed. No pertinent family history. Social History:  reports that he has been smoking E-cigarettes.  He has been smoking about 0.00 packs per day. He has never used smokeless tobacco. He reports that he drinks alcohol. He reports that he does not use illicit drugs.  Allergies: No Known Allergies  Medications Prior to Admission  Medication Sig Dispense Refill  . hydrOXYzine (VISTARIL) 25 MG capsule Take 25 mg by mouth 3 (three) times daily as needed for anxiety.      Marland Kitchen. HYDROcodone-acetaminophen (NORCO/VICODIN) 5-325 MG per tablet Take 1-2 tablets by mouth every 4 (four) hours as needed.  20 tablet  0    No results found for this or any previous visit (from the past 48 hour(s)). No results found.  Review of Systems  Constitutional: Negative.   HENT: Negative.   Eyes: Negative.   Respiratory: Negative.   Cardiovascular: Negative.   Gastrointestinal: Negative.   Genitourinary: Negative.   Musculoskeletal: Positive for joint pain.  Skin: Negative.   Neurological: Negative.   Endo/Heme/Allergies: Negative.   Psychiatric/Behavioral: Negative.     Blood pressure 124/63, pulse 55, temperature 99.2 F (37.3 C), temperature source Oral, resp. rate 19, height 5\' 9"  (1.753 m), weight 83.462 kg (184 lb), SpO2  100.00%. Physical Exam  Constitutional: He appears well-developed.  HENT:  Head: Normocephalic.  Eyes: Pupils are equal, round, and reactive to light.  Neck: Normal range of motion.  Cardiovascular: Normal rate.   Respiratory: Effort normal.  Neurological: He is alert.  Skin: Skin is warm.  Psychiatric: He has a normal mood and affect.   examination the right knee demonstrates an effusion lateral joint line tenderness positive Lockman does have some fibular collateral ligament laxity but it appears grossly symmetric with the left. Posterior lateral rotatory instability difficult to assess because of the patient's awake status. Ankle dorsi flexion plantar flexion is intact his range of motion is from full extension 230 of flexion. PCL is intact bilaterally.  Assessment/Plan Impression is anterior cruciate ligament tear lateral meniscal tear fibular collateral ligament strain possible posterior lateral rotatory instability plan patient reconstruction hamstring autograft possible fibular collateral ligament reconstruction for stroke or reconstruction using hamstring allograft. Risk and benefits discussed with patient including but not limited to infection nerve vessel damage knee stiffness possible need for more surgery all questions answered plan is to do a very thorough examination under anesthesia to decide about whether or not the lateral side needs additional stability after the anterior cruciate ligament. I also reevaluate after the anterior cruciate ligament graft is placed and tension on the lateral side for lateral sided instability. All questions answered  DEAN,GREGORY SCOTT 12/04/2013, 1:53 PM

## 2013-12-04 NOTE — Anesthesia Procedure Notes (Addendum)
Anesthesia Regional Block:  Femoral nerve block  Pre-Anesthetic Checklist: ,, timeout performed, Correct Patient, Correct Site, Correct Laterality, Correct Procedure, Correct Position, site marked, Risks and benefits discussed,  Surgical consent,  Pre-op evaluation,  At surgeon's request and post-op pain management  Laterality: Lower and Right  Prep: chloraprep       Needles:  Injection technique: Single-shot  Needle Type: Echogenic Needle          Additional Needles:  Procedures: ultrasound guided (picture in chart) Femoral nerve block  Nerve Stimulator or Paresthesia:  Response: quad, 0.2 mA,   Additional Responses:   Narrative:  Start time: 12/04/2013 1:19 PM End time: 12/04/2013 1:25 PM Injection made incrementally with aspirations every 5 mL.  Performed by: Personally  Anesthesiologist: Maple HudsonMoser  Additional Notes: H+P and labs reviewed, risks and benefits discussed with patient, procedure tolerated well without complications   Procedure Name: Intubation Date/Time: 12/04/2013 2:37 PM Performed by: Romie MinusOCK, Shirlena Brinegar K Pre-anesthesia Checklist: Patient identified, Emergency Drugs available, Suction available, Patient being monitored and Timeout performed Patient Re-evaluated:Patient Re-evaluated prior to inductionOxygen Delivery Method: Circle system utilized Preoxygenation: Pre-oxygenation with 100% oxygen Intubation Type: IV induction Ventilation: Mask ventilation without difficulty Laryngoscope Size: Miller and 2 Grade View: Grade I Tube type: Oral Tube size: 7.5 mm Number of attempts: 1 Airway Equipment and Method: Stylet Placement Confirmation: ETT inserted through vocal cords under direct vision,  positive ETCO2,  CO2 detector and breath sounds checked- equal and bilateral Secured at: 22 cm Tube secured with: Tape Dental Injury: Teeth and Oropharynx as per pre-operative assessment

## 2013-12-05 ENCOUNTER — Encounter (HOSPITAL_COMMUNITY): Payer: Self-pay | Admitting: Orthopedic Surgery

## 2013-12-05 MED ORDER — OXYCODONE HCL 5 MG PO TABS: 5.0000 mg | ORAL_TABLET | ORAL | Status: DC | PRN

## 2013-12-05 MED ORDER — ASPIRIN 325 MG PO TABS: 325.0000 mg | ORAL_TABLET | Freq: Every day | ORAL | Status: DC

## 2013-12-05 MED ORDER — METHOCARBAMOL 500 MG PO TABS: 500.0000 mg | ORAL_TABLET | Freq: Four times a day (QID) | ORAL | Status: DC | PRN

## 2013-12-05 NOTE — Progress Notes (Signed)
Patient discharged from room 5N17 at this time. Alert and in stable condition. IV site d/c'd. Instructions read to patient and family and understanding verbalized. Left unit via wheelchair with family and belongings at side.

## 2013-12-05 NOTE — Progress Notes (Signed)
Subjective: Pt stable Foot perfused sensate mobile   Objective: Vital signs in last 24 hours: Temp:  [97.4 F (36.3 C)-99.2 F (37.3 C)] 98.4 F (36.9 C) (06/17 0432) Pulse Rate:  [52-86] 52 (06/17 0432) Resp:  [9-27] 16 (06/17 0432) BP: (111-158)/(53-99) 111/62 mmHg (06/17 0432) SpO2:  [99 %-100 %] 99 % (06/17 0432) Weight:  [83.462 kg (184 lb)] 83.462 kg (184 lb) (06/16 0906)  Intake/Output from previous day: 06/16 0701 - 06/17 0700 In: 2040 [P.O.:240; I.V.:1800] Out: 500 [Urine:500] Intake/Output this shift:    Exam:  Sensation intact distally Intact pulses distally  Labs: No results found for this basename: HGB,  in the last 72 hours No results found for this basename: WBC, RBC, HCT, PLT,  in the last 72 hours No results found for this basename: NA, K, CL, CO2, BUN, CREATININE, GLUCOSE, CALCIUM,  in the last 72 hours No results found for this basename: LABPT, INR,  in the last 72 hours  Assessment/Plan: Dc today after dressing change - 1 hour cpm   DEAN,GREGORY SCOTT 12/05/2013, 7:14 AM

## 2013-12-05 NOTE — Progress Notes (Signed)
Dressing to right knee changed prior discharge per order.

## 2013-12-05 NOTE — Evaluation (Signed)
Physical Therapy Evaluation and Discharge  Patient Details Name: Joshua [name] MRN: [id number] DOB: 08/08/1970 Today's Date: 12/05/2013   History of Present Illness  43 year old patient who injured his right knee several weeks ago he presents with instability pain and swelling. MRI scan consistent with anterior cruciate ligament tear lateral meniscal tear possible fibular collateral ligament strain. s/p Rt ACL reconstruction and lateral meniscus tear.   Clinical Impression  Pt adm from home due to above. Pt at mod i to supervision of all mobility at this time. All education completed regarding use of crutches and safety. Pt safe from mobility standpoint to D/C home with wife. Will sign off at this time. Thanks.    Follow Up Recommendations Outpatient PT;Supervision - Intermittent    Equipment Recommendations  None recommended by PT    Recommendations for Other Services       Precautions / Restrictions Precautions Precautions: Knee Required Braces or Orthoses: Knee Immobilizer - Right Knee Immobilizer - Right: Other (comment) (not specified in MD note) Restrictions Weight Bearing Restrictions: Yes RLE Weight Bearing: Partial weight bearing RLE Partial Weight Bearing Percentage or Pounds: 50      Mobility  Bed Mobility Overal bed mobility: Modified Independent             General bed mobility comments: incr time due to pain; no physical (A) needed  Transfers Overall transfer level: Modified independent Equipment used: Crutches             General transfer comment: demo good balance and technique with transfers and ability to use crutches   Ambulation/Gait Ambulation/Gait assistance: Supervision Ambulation Distance (Feet): 100 Feet Assistive device: Crutches Gait Pattern/deviations: Step-to pattern;Decreased stance time - right;Decreased step length - left;Wide base of support;Trunk flexed Gait velocity: decreased Gait velocity interpretation: Below normal  speed for age/gender General Gait Details: attempting to incr WB to 50% on Rt LE; has incr difficulty due to pain; supervision for safety    Stairs Stairs: Yes Stairs assistance: Supervision Stair Management: One rail Right;Sideways;Step to pattern Number of Stairs: 2 General stair comments: demo and cues for safety and technique; wife present; pt able to perform with supervision  Wheelchair Mobility    Modified Rankin (Stroke Patients Only)       Balance Overall balance assessment: Modified Independent;No apparent balance deficits (not formally assessed)                                           Pertinent Vitals/Pain 4-5/10; patient repositioned for comfort     Home Living Family/patient expects to be discharged to:: Private residence Living Arrangements: Spouse/significant other Available Help at Discharge: Family;Friend(s);Available 24 hours/day Type of Home: House Home Access: Stairs to enter Entrance Stairs-Rails: Right Entrance Stairs-Number of Steps: 3 Home Layout: One level Home Equipment: Crutches Additional Comments: pt has walk in shower    Prior Function Level of Independence: Independent         Comments: pt was working up until surgery; drives trucks     Higher education careers adviserHand Dominance        Extremity/Trunk Assessment   Upper Extremity Assessment: Overall WFL for tasks assessed           Lower Extremity Assessment: RLE deficits/detail      Cervical / Trunk Assessment: Normal  Communication   Communication: No difficulties  Cognition Arousal/Alertness: Awake/alert Behavior During Therapy: WFL for tasks assessed/performed Overall  Cognitive Status: Within Functional Limits for tasks assessed                      General Comments      Exercises        Assessment/Plan    PT Assessment Patent does not need any further PT services  PT Diagnosis     PT Problem List    PT Treatment Interventions     PT Goals  (Current goals can be found in the Care Plan section) Acute Rehab PT Goals Patient Stated Goal: home today PT Goal Formulation: No goals set, d/c therapy    Frequency     Barriers to discharge        Co-evaluation               End of Session Equipment Utilized During Treatment: Right knee immobilizer Activity Tolerance: Patient tolerated treatment well Patient left: in chair;with call bell/phone within reach;with family/visitor present Nurse Communication: Mobility status         Time: 0820-0840 PT Time Calculation (min): 20 min   Charges:   PT Evaluation $Initial PT Evaluation Tier I: 1 Procedure PT Treatments $Gait Training: 8-22 mins   PT G CodesDonell Sievert:          [place], Brittany N, [place]  [phone] 12/05/2013, 8:54 AM

## 2013-12-05 NOTE — Op Note (Signed)
NAME:  Joshua [name], Joshua [name]              ACCOUNT NO.:  [id number]  MEDICAL RECORD NO.:  [id number]  LOCATION:  [id number]                        FACILITY:  [facility]  PHYSICIAN:  Burnard BuntingG. Scott Dean, M.D.    DATE OF BIRTH:  1970/07/01  DATE OF PROCEDURE: DATE OF DISCHARGE:                              OPERATIVE REPORT   PREOPERATIVE DIAGNOSIS:  Right knee anterior cruciate ligament tear, lateral meniscal tear.  POSTOPERATIVE DIAGNOSIS:  Right knee anterior cruciate ligament tear, lateral meniscal tear.  PROCEDURE:  Right knee anterior cruciate ligament reconstruction using hamstring autograft, 9.5 mm, partial lateral meniscectomy.  SURGEON:  Burnard BuntingG. Scott Dean, M.D.  ASSISTANT:  Wende NeighborsSheila M. Vernon, P.A.  ANESTHESIA:  General.  INDICATIONS:  Joshua [name] is a patient with right knee pain and instability, presents for operative management after explanation of risks and benefits.  PROCEDURE IN DETAIL:  The patient was brought to the operating room where general endotracheal anesthesia was induced.  Preoperative antibiotics were administered.  Time-out was called.  Right leg was examined under anesthesia and compared to the contralateral left leg. The patient had good stability to varus and valgus stress at 0 degrees with about 1-2 mm of opening on each side, the varus stress at 0 degrees.  At 30 degrees of the varus stress, the patient had about 3-4 mm of opening on the right compared to about 3 mm on the left.  There was no asymmetric posterolateral rotatory instability.  PCL was intact bilaterally, ACL out on the right intact on the left.  At this time, the right leg was prescrubbed with alcohol and Betadine, which was allowed to air dry, prepped with DuraPrep solution and draped in a sterile manner.  Leg was elevated and exsanguinated with Esmarch wrap. Tourniquet was inflated.  Time-out was called.  Semitendinosus tendon was harvested through a 2-cm vertical incision just below the tibial tubercle.  Skin  and subcutaneous tissue were sharply divided.  Care was taken to avoid injury to the saphenous nerve.  At this time, graft was harvested and prepared on the back table by Maud DeedSheila Vernon to size the 9.5 mm using dual Endobutton technique.  Concurrently, the incision was irrigated.  Anterior-inferior lateral and anterior-inferior medial portals were established.  Diagnostic arthroscopy was performed.  The patient had the following findings: 1. Grade 2-3 chondromalacia landing zone of the patella with grade 1     chondromalacia at the junction of the medial and lateral facets of     the patella. 2. Small focal 5 x 5-mm full-thickness chondral defect on the proximal     aspect of the medial femoral condyle. 3. Intact medial meniscus and otherwise medial compartment articular     cartilage. 4. Torn ACL and intact PCL. 5. Tear of the lateral meniscus, which was radial-type tear about 25%     thickness with partial thickness of vertical tear, which was     stable, which was posterior to the popliteus.  No definitive     chondral defects seen on the femur.  Following arthroscopic     evaluation, ACL stump was debrided and notchplasty performed, over-     the-top position identified.  Lateral meniscal tear debrided.  Microfracture performed on the chondral defect on the medial     femoral condyle.  No loose bodies in the medial and lateral gutter     present.  The femoral guide was then drilled outside in using the 9     flip cutter in the 9 o'clock position.  The tibial tunnel was then     drilled to the posterior aspect of the native ACL footprint using     the flip cutter.  At this time, the graft was passed after filling     each tunnel with StimuBlast.  Good fixation was achieved.  Good     depths of graft was achieved, graft tension and full extension.     The patient had excellent range of motion and good stability.     Again, no asymmetric posterolateral rotatory instability was      present.  Knee joint was then thoroughly irrigated.  Tourniquet was     released.  Thorough irrigation was again performed.  Portals were     closed using 3-0 nylon.  Harvest site was closed using 0 Vicryl     suture, 2-0 Vicryl suture and a 3-0 Prolene.  Bulky dressing and     knee immobilizer were placed.  Velna HatchetSheila Vernon's assistance was     required at all times for retraction as well as graft preparation,     tunnel passes, tunnel drilling.  Her assistance was medical     necessity.     Burnard BuntingG. Scott Dean, M.D.     GSD/MEDQ  D:  12/04/2013  T:  12/05/2013  Job:  [id number]

## 2013-12-08 NOTE — Discharge Summary (Signed)
Physician Discharge Summary  Patient ID: Danley DankerRobert E Clendenen MRN: [id number] DOB/AGE: 42/07/1970 42 y.o.  Admit date: 12/04/2013 Discharge date: 12/05/2013 Admission Diagnoses:  Active Problems:   ACL tear   Discharge Diagnoses:  Same  Surgeries: Procedure(s): RECONSTRUCTION ANTERIOR CRUCIATE LIGAMENT (ACL) WITH HAMSTRING GRAFT, PARTIAL LATERAL MENISCECTOMY,  on 12/04/2013   Consultants:    Discharged Condition: Stable  Hospital Course: Danley DankerRobert E Steinkamp is an 43 y.o. male who was admitted 12/04/2013 with a chief complaint of knee pain and instability, and found to have a diagnosis of .acl tear  They were brought to the operating room on 12/04/2013 and underwent the above named procedures.  Tolerated well and dced home pwb on POD 1  Antibiotics given:  Anti-infectives   Start     Dose/Rate Route Frequency Ordered Stop   12/04/13 2200  ceFAZolin (ANCEF) IVPB 2 g/50 mL premix     2 g 100 mL/hr over 30 Minutes Intravenous 3 times per day 12/04/13 1808 12/05/13 0622   12/04/13 2200  ceFAZolin (ANCEF) IVPB 2 g/50 mL premix  Status:  Discontinued     2 g 100 mL/hr over 30 Minutes Intravenous 3 times per day 12/04/13 1808 12/04/13 1817   12/04/13 0600  ceFAZolin (ANCEF) IVPB 2 g/50 mL premix     2 g 100 mL/hr over 30 Minutes Intravenous On call to O.R. 12/03/13 1401 12/04/13 1439    .  Recent vital signs:  Filed Vitals:   12/05/13 0432  BP: 111/62  Pulse: 52  Temp: 98.4 F (36.9 C)  Resp: 16    Recent laboratory studies:  Results for orders placed during the hospital encounter of 11/29/13  BASIC METABOLIC PANEL      Result Value Ref Range   Sodium 140  137 - 147 mEq/L   Potassium 4.2  3.7 - 5.3 mEq/L   Chloride 101  96 - 112 mEq/L   CO2 26  19 - 32 mEq/L   Glucose, Bld 112 (*) 70 - 99 mg/dL   BUN 12  6 - 23 mg/dL   Creatinine, Ser 8.290.91  0.50 - 1.35 mg/dL   Calcium 56.210.1  8.4 - 13.010.5 mg/dL   GFR calc non Af Amer >90  >90 mL/min   GFR calc Af Amer >90  >90 mL/min  CBC   Result Value Ref Range   WBC 10.2  4.0 - 10.5 K/uL   RBC 5.15  4.22 - 5.81 MIL/uL   Hemoglobin 15.9  13.0 - 17.0 g/dL   HCT 86.546.4  78.439.0 - 69.652.0 %   MCV 90.1  78.0 - 100.0 fL   MCH 30.9  26.0 - 34.0 pg   MCHC 34.3  30.0 - 36.0 g/dL   RDW 29.513.2  28.411.5 - 13.215.5 %   Platelets 240  150 - 400 K/uL    Discharge Medications:     Medication List    STOP taking these medications       HYDROcodone-acetaminophen 5-325 MG per tablet  Commonly known as:  NORCO/VICODIN      TAKE these medications       aspirin 325 MG tablet  Take 1 tablet (325 mg total) by mouth daily.     hydrOXYzine 25 MG capsule  Commonly known as:  VISTARIL  Take 25 mg by mouth 3 (three) times daily as needed for anxiety.     methocarbamol 500 MG tablet  Commonly known as:  ROBAXIN  Take 1 tablet (500 mg total) by mouth every 6 (six)  hours as needed for muscle spasms.     oxyCODONE 5 MG immediate release tablet  Commonly known as:  Oxy IR/ROXICODONE  Take 1-2 tablets (5-10 mg total) by mouth every 3 (three) hours as needed for breakthrough pain.        Diagnostic Studies: No results found.  Disposition: 01-Home or Self Care      Discharge Instructions   Call MD / Call 911    Complete by:  As directed   If you experience chest pain or shortness of breath, CALL 911 and be transported to the hospital emergency room.  If you develope a fever above 101 F, pus (white drainage) or increased drainage or redness at the wound, or calf pain, call your surgeon's office.     Constipation Prevention    Complete by:  As directed   Drink plenty of fluids.  Prune juice may be helpful.  You may use a stool softener, such as Colace (over the counter) 100 mg twice a day.  Use MiraLax (over the counter) for constipation as needed.     Diet - low sodium heart healthy    Complete by:  As directed      Discharge instructions    Complete by:  As directed   CPM 1 hour 3 times a day increase degrees daily Partial weight bearing right  leg with crutches and knee immobilizer Work on making the leg straight Keep incisions dry     Increase activity slowly as tolerated    Complete by:  As directed               Signed: DEAN,GREGORY SCOTT 12/08/2013, 9:15 AM

## 2014-12-03 ENCOUNTER — Emergency Department (HOSPITAL_COMMUNITY)
Admission: EM | Admit: 2014-12-03 | Discharge: 2014-12-03 | Disposition: A | Discharge status: Home / Self Care | Payer: BLUE CROSS/BLUE SHIELD | Attending: Emergency Medicine | Admitting: Emergency Medicine

## 2014-12-03 ENCOUNTER — Encounter (HOSPITAL_COMMUNITY): Payer: Self-pay | Admitting: *Deleted

## 2014-12-03 DIAGNOSIS — Z79899 Other long term (current) drug therapy: Secondary | ICD-10-CM | POA: Insufficient documentation

## 2014-12-03 DIAGNOSIS — F329 Major depressive disorder, single episode, unspecified: Secondary | ICD-10-CM | POA: Diagnosis not present

## 2014-12-03 DIAGNOSIS — Z720 Tobacco use: Secondary | ICD-10-CM | POA: Insufficient documentation

## 2014-12-03 DIAGNOSIS — F419 Anxiety disorder, unspecified: Secondary | ICD-10-CM | POA: Diagnosis present

## 2014-12-03 DIAGNOSIS — F410 Panic disorder [episodic paroxysmal anxiety] without agoraphobia: Secondary | ICD-10-CM

## 2014-12-03 MED ORDER — LORAZEPAM 1 MG PO TABS
1.0000 mg | ORAL_TABLET | Freq: Once | ORAL | Status: AC
  Administered 2014-12-03: 1 mg via ORAL
  Filled 2014-12-03: qty 1

## 2014-12-03 MED ORDER — HYDROXYZINE PAMOATE 25 MG PO CAPS: 25.0000 mg | ORAL_CAPSULE | Freq: Three times a day (TID) | ORAL | Status: DC | PRN

## 2014-12-03 MED ORDER — HYDROXYZINE HCL 25 MG PO TABS
25.0000 mg | ORAL_TABLET | Freq: Once | ORAL | Status: AC
Start: 2014-12-03 — End: 2014-12-03
  Administered 2014-12-03: 25 mg via ORAL
  Filled 2014-12-03: qty 1

## 2014-12-03 NOTE — ED Provider Notes (Signed)
CSN: [id number]     Arrival date & time 12/03/14  0138 History   First MD Initiated Contact with Patient 12/03/14 0202     Chief Complaint  Patient presents with  . Anxiety     (Consider location/radiation/quality/duration/timing/severity/associated sxs/prior Treatment) HPI 44 year old male presents to the emergency department with complaint of panic attack.  Patient has history of depression and anxiety.  He reports that he has been under more stress recently due to going through divorce.  He has run out of his Vistaril and Effexor, has not been on these medications the last year.  He been seen in the past by [facility], but has not been seen in some time.  He denies any SI or HI.  He reports that he will follow back up with a therapist but is having difficulties going to sleep tonight due to anxiety Past Medical History  Diagnosis Date  . Depression     has been on Effexor, not taking at this time  . Anxiety     takes Hydroxyzine   Past Surgical History  Procedure Laterality Date  . No past surgeries    . Anterior cruciate ligament repair Right 12/04/2013    Procedure: RECONSTRUCTION ANTERIOR CRUCIATE LIGAMENT (ACL) WITH HAMSTRING GRAFT, PARTIAL LATERAL MENISCECTOMY, POSSIBLE LCL/PLC RECONSTRUCTION.;  Surgeon: Cammy Copa, MD;  Location: [facility] OR;  Service: Orthopedics;  Laterality: Right;   No family history on file. History  Substance Use Topics  . Smoking status: Current Every Day Smoker    Types: E-cigarettes    Last Attempt to Quit: 11/30/2010  . Smokeless tobacco: Never Used     Comment: Uses the vapes, quit cigarettes 3 years ago  . Alcohol Use: Yes     Comment: occasional    Review of Systems    Allergies  Review of patient's allergies indicates no known allergies.  Home Medications   Prior to Admission medications   Medication Sig Start Date End Date Taking? Authorizing Provider  hydrOXYzine (VISTARIL) 25 MG capsule Take 1 capsule (25 mg total) by mouth 3  (three) times daily as needed for anxiety. 12/03/14   Marisa Severin, MD  venlafaxine (EFFEXOR) 75 MG tablet Take 75 mg by mouth 2 (two) times daily.    Historical Provider, MD   BP 160/93 mmHg  Pulse 67  Temp(Src) 98.6 F (37 C) (Oral)  Resp 18  SpO2 99% Physical Exam  Constitutional: He is oriented to person, place, and time. He appears well-developed and well-nourished.  HENT:  Head: Normocephalic and atraumatic.  Nose: Nose normal.  Mouth/Throat: Oropharynx is clear and moist.  Eyes: Conjunctivae and EOM are normal. Pupils are equal, round, and reactive to light.  Neck: Normal range of motion. Neck supple. No JVD present. No tracheal deviation present. No thyromegaly present.  Cardiovascular: Normal rate, regular rhythm, normal heart sounds and intact distal pulses.  Exam reveals no gallop and no friction rub.   No murmur heard. Pulmonary/Chest: Effort normal and breath sounds normal. No stridor. No respiratory distress. He has no wheezes. He has no rales. He exhibits no tenderness.  Abdominal: Soft. Bowel sounds are normal. He exhibits no distension and no mass. There is no tenderness. There is no rebound and no guarding.  Musculoskeletal: Normal range of motion. He exhibits no edema or tenderness.  Lymphadenopathy:    He has no cervical adenopathy.  Neurological: He is alert and oriented to person, place, and time. He displays normal reflexes. He exhibits normal muscle tone. Coordination normal.  Skin: Skin is warm and dry. No rash noted. No erythema. No pallor.  Psychiatric: His behavior is normal. Judgment and thought content normal.  Patient reports anxiety.  He denies suicidal thoughts, homicidal thoughts or delusions/hallucinations  Nursing note and vitals reviewed.   ED Course  Procedures (including critical care time) Labs Review Labs Reviewed - No data to display  Imaging Review No results found.   EKG Interpretation None      MDM   Final diagnoses:  Anxiety  attack    44 year old male with panic attack.  He has history of depression, anxiety.  Plan for Vistaril and Ativan here tonight, prescription for Vistaril for home use.  Patient to be given outpatient resources.    Marisa Severin, MD 12/03/14 0600

## 2014-12-03 NOTE — ED Notes (Signed)
Pt reports anxiety attack since yesterday.  Used to take effexor and hydroxyzine for it but has not taken them for a while.  Pt reports going through divorce and has put him in a lot of stress.

## 2014-12-03 NOTE — Discharge Instructions (Signed)

## 2015-05-17 IMAGING — CR DG KNEE COMPLETE 4+V*R*
4 series · 4 of 4 positions shown · non-contrast
Comparison: None.

CLINICAL DATA: Right knee pain, twisting injury.

EXAM:
RIGHT KNEE - COMPLETE 4+ VIEW

[t knee ap right]
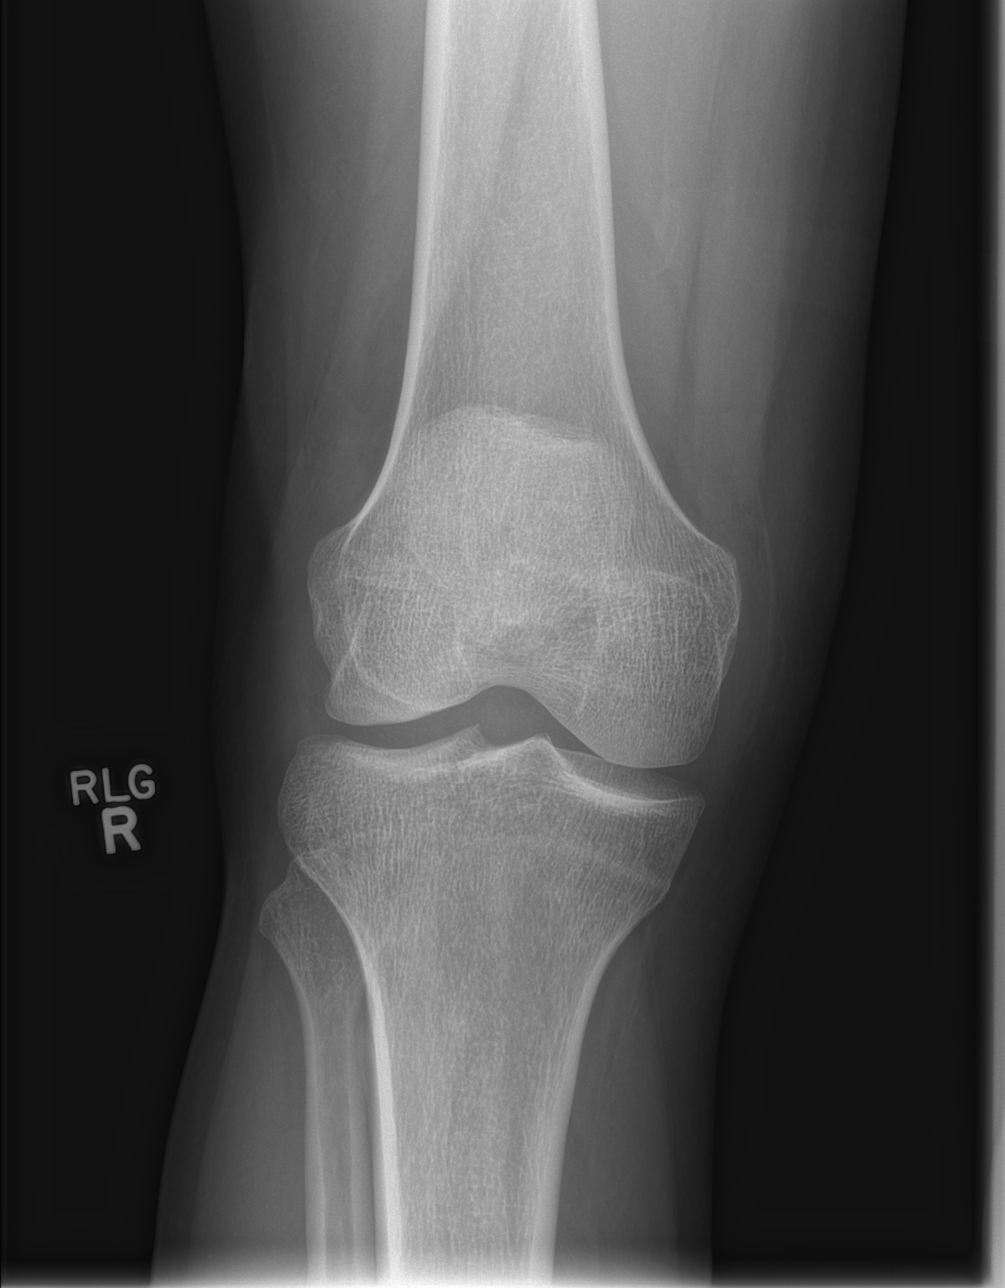

[t knee obl right (1 of 2)]
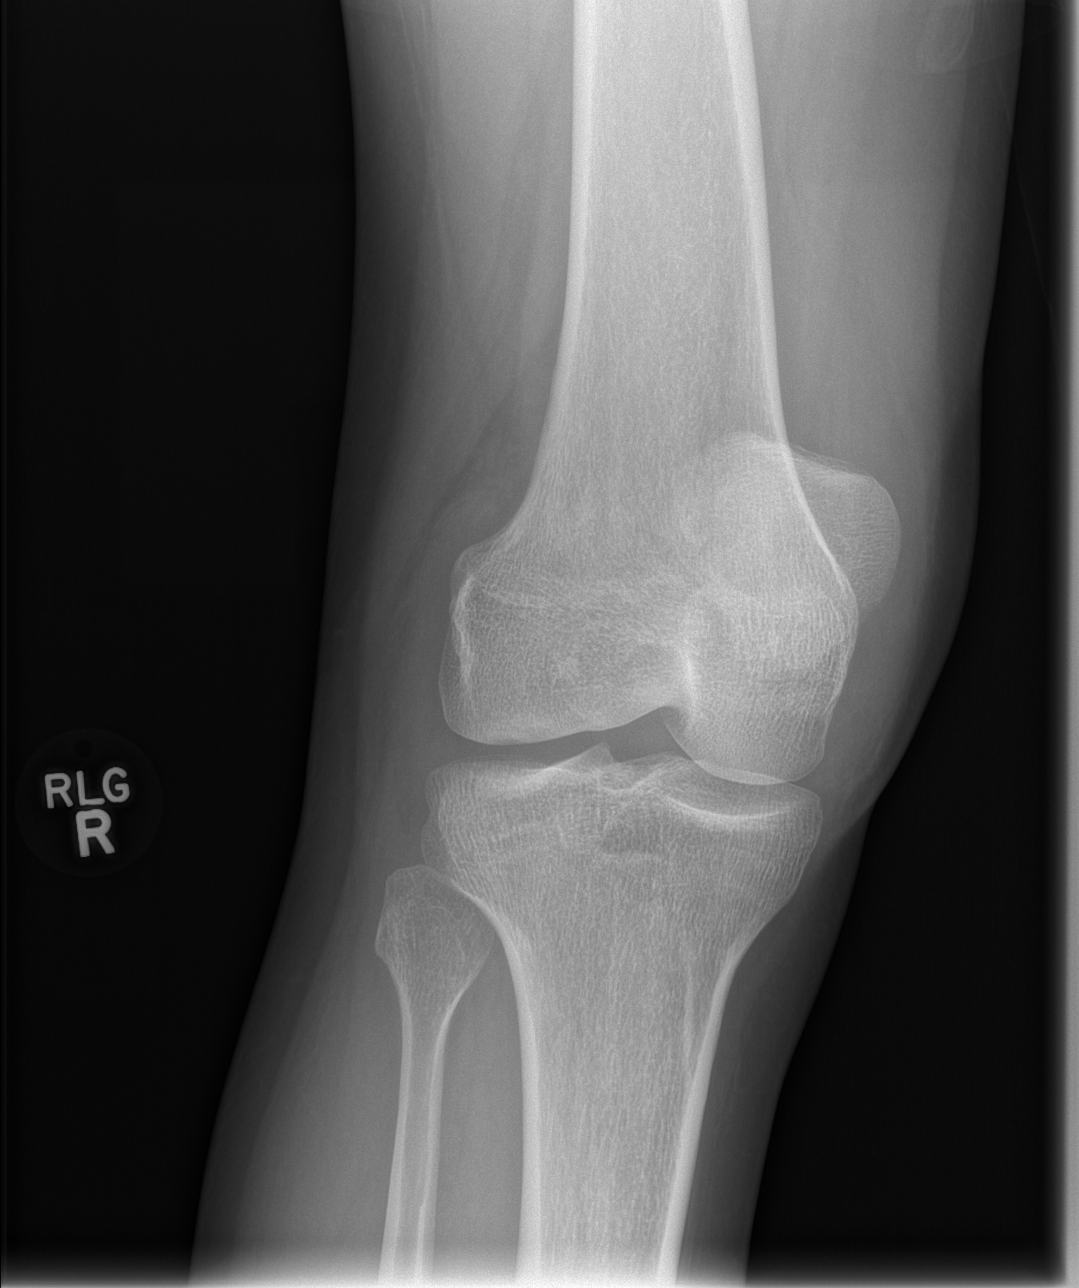

[t knee obl right (2 of 2)]
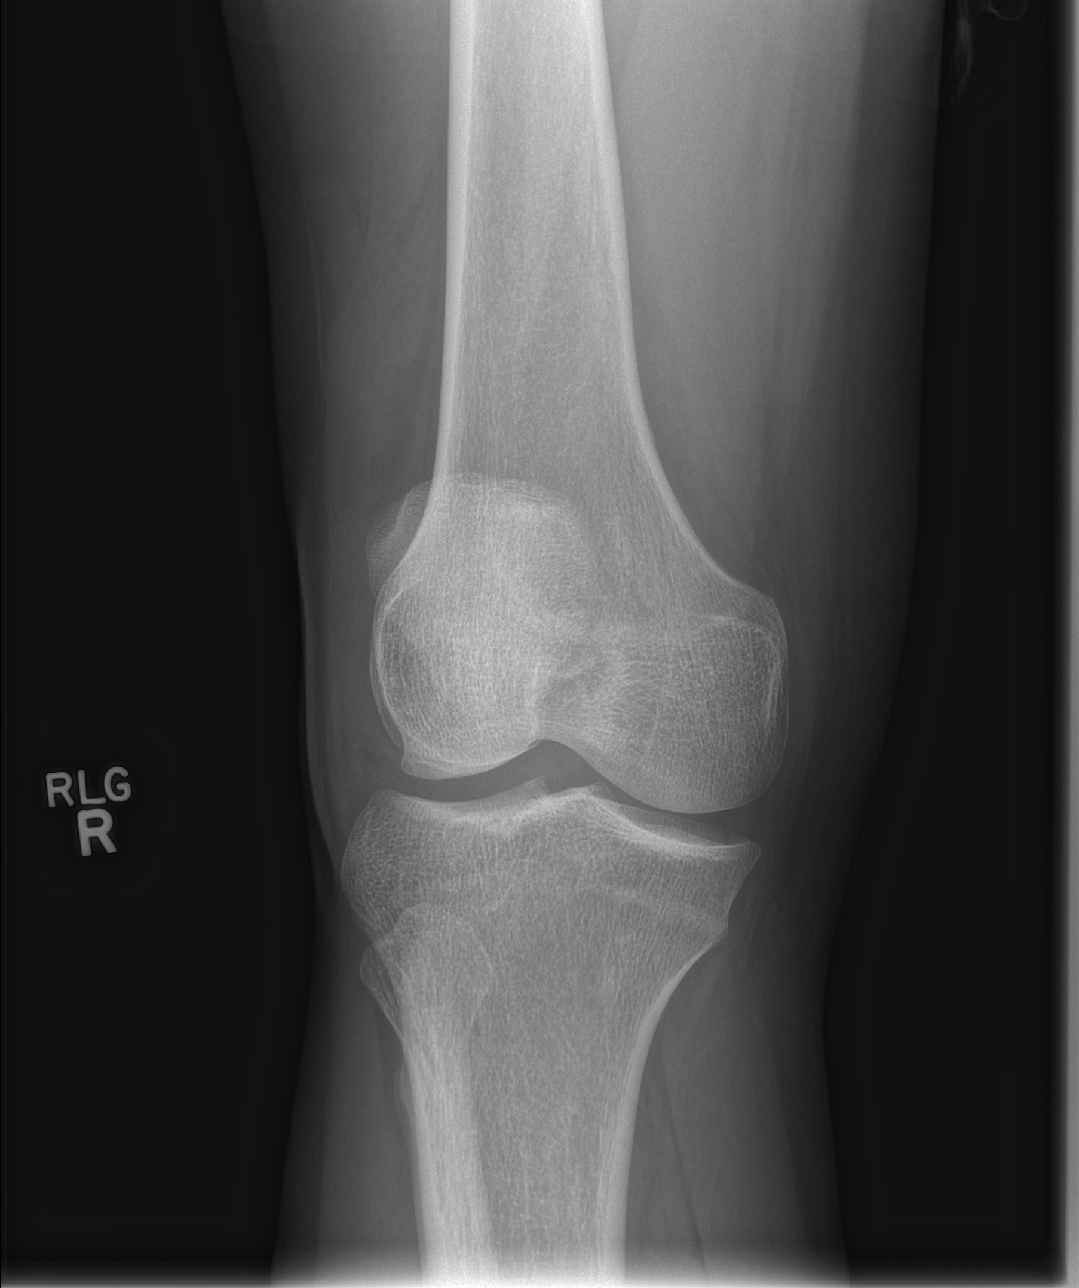

[t knee lat right]
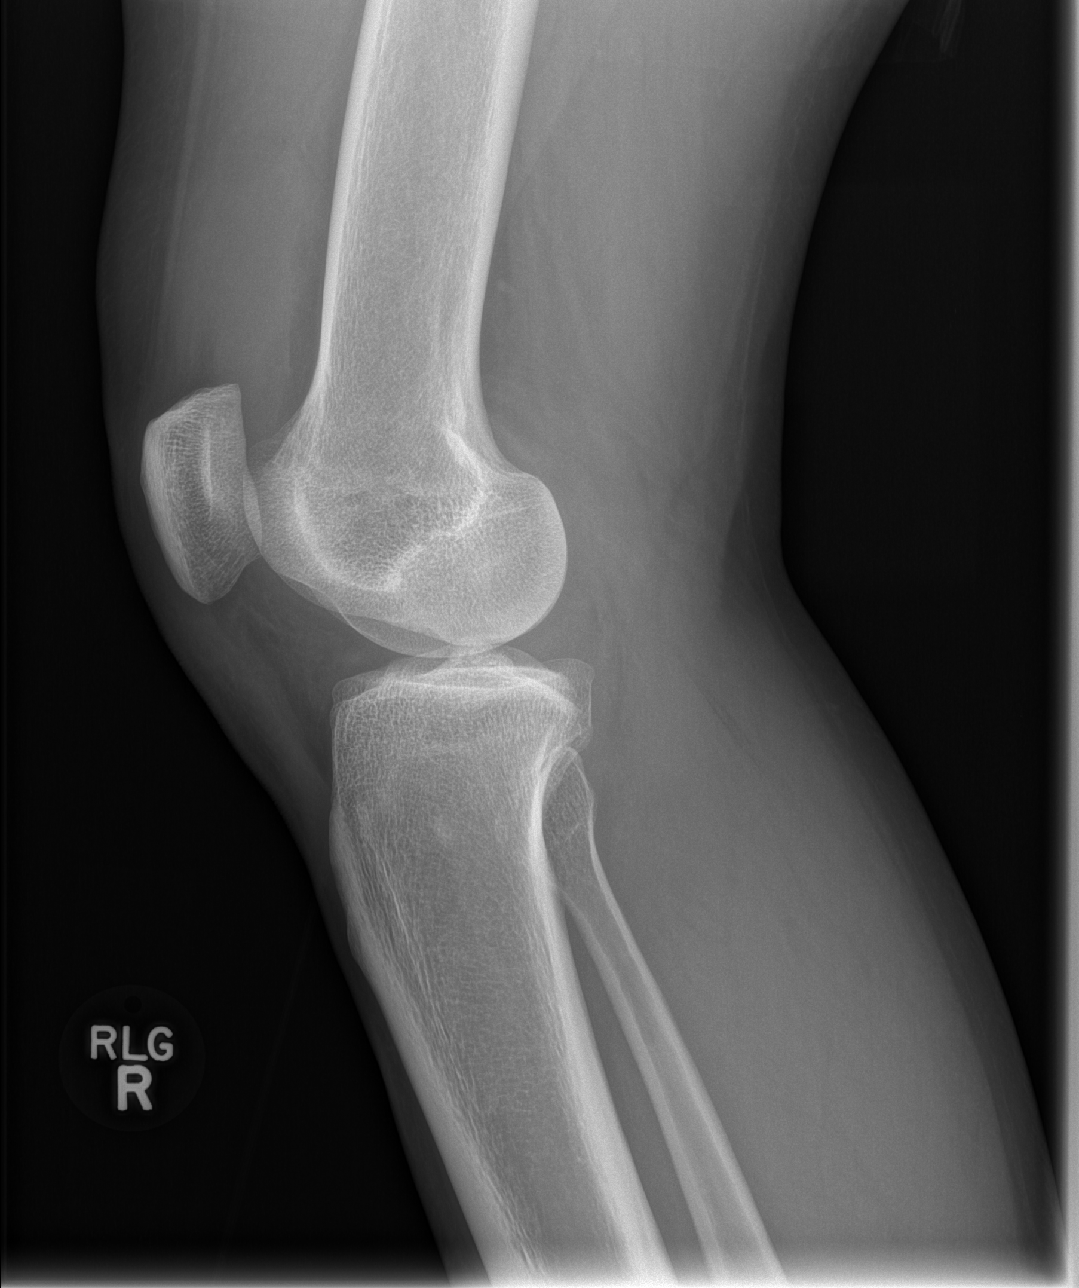

[4 of 4 positions shown; findings below may reference images not displayed]

FINDINGS: There is a moderate right knee joint effusion. Deep lateral femoral
notch noted on the lateral view which can be associated with ACL
injuries. No additional bony abnormality. No subluxation or
dislocation.
IMPRESSION: Moderate joint effusion. Deep lateral femoral notch sign which can
be seen with ACL injuries. Consider further evaluation with MRI if
felt clinically warranted.

## 2016-07-22 DIAGNOSIS — E119 Type 2 diabetes mellitus without complications: Secondary | ICD-10-CM

## 2016-07-22 HISTORY — DX: Type 2 diabetes mellitus without complications: E11.9

## 2016-08-02 ENCOUNTER — Observation Stay (HOSPITAL_COMMUNITY)
Admission: EM | Admit: 2016-08-02 | Discharge: 2016-08-03 | Disposition: A | Discharge status: Home / Self Care | Payer: BLUE CROSS/BLUE SHIELD | Attending: Student in an Organized Health Care Education/Training Program | Admitting: Student in an Organized Health Care Education/Training Program

## 2016-08-02 ENCOUNTER — Encounter (HOSPITAL_COMMUNITY): Payer: Self-pay | Admitting: Emergency Medicine

## 2016-08-02 DIAGNOSIS — E1169 Type 2 diabetes mellitus with other specified complication: Secondary | ICD-10-CM | POA: Diagnosis present

## 2016-08-02 DIAGNOSIS — E119 Type 2 diabetes mellitus without complications: Secondary | ICD-10-CM | POA: Diagnosis present

## 2016-08-02 DIAGNOSIS — Z833 Family history of diabetes mellitus: Secondary | ICD-10-CM

## 2016-08-02 DIAGNOSIS — Z6826 Body mass index (BMI) 26.0-26.9, adult: Secondary | ICD-10-CM | POA: Diagnosis not present

## 2016-08-02 DIAGNOSIS — E860 Dehydration: Secondary | ICD-10-CM | POA: Insufficient documentation

## 2016-08-02 DIAGNOSIS — E663 Overweight: Secondary | ICD-10-CM | POA: Insufficient documentation

## 2016-08-02 DIAGNOSIS — E871 Hypo-osmolality and hyponatremia: Secondary | ICD-10-CM | POA: Diagnosis not present

## 2016-08-02 DIAGNOSIS — E1165 Type 2 diabetes mellitus with hyperglycemia: Secondary | ICD-10-CM | POA: Insufficient documentation

## 2016-08-02 DIAGNOSIS — F17290 Nicotine dependence, other tobacco product, uncomplicated: Secondary | ICD-10-CM | POA: Insufficient documentation

## 2016-08-02 DIAGNOSIS — F419 Anxiety disorder, unspecified: Secondary | ICD-10-CM | POA: Diagnosis not present

## 2016-08-02 DIAGNOSIS — N179 Acute kidney failure, unspecified: Secondary | ICD-10-CM | POA: Diagnosis present

## 2016-08-02 DIAGNOSIS — Z794 Long term (current) use of insulin: Secondary | ICD-10-CM

## 2016-08-02 DIAGNOSIS — E1110 Type 2 diabetes mellitus with ketoacidosis without coma: Secondary | ICD-10-CM | POA: Diagnosis not present

## 2016-08-02 DIAGNOSIS — R739 Hyperglycemia, unspecified: Secondary | ICD-10-CM

## 2016-08-02 DIAGNOSIS — R05 Cough: Secondary | ICD-10-CM | POA: Diagnosis present

## 2016-08-02 DIAGNOSIS — F418 Other specified anxiety disorders: Secondary | ICD-10-CM

## 2016-08-02 DIAGNOSIS — E1310 Other specified diabetes mellitus with ketoacidosis without coma: Secondary | ICD-10-CM

## 2016-08-02 HISTORY — DX: Type 2 diabetes mellitus without complications: E11.9

## 2016-08-02 LAB — BASIC METABOLIC PANEL
ANION GAP: 20 — AB (ref 5–15)
Anion gap: 15 (ref 5–15)
Anion gap: 11 (ref 5–15)
Anion gap: 12 (ref 5–15)
BUN: 15 mg/dL (ref 6–20)
BUN: 13 mg/dL (ref 6–20)
BUN: 10 mg/dL (ref 6–20)
BUN: 10 mg/dL (ref 6–20)
CALCIUM: 9.5 mg/dL (ref 8.9–10.3)
CHLORIDE: 99 mmol/L — AB (ref 101–111)
CHLORIDE: 102 mmol/L (ref 101–111)
CO2: 18 mmol/L — ABNORMAL LOW (ref 22–32)
CO2: 22 mmol/L (ref 22–32)
CO2: 25 mmol/L (ref 22–32)
CO2: 22 mmol/L (ref 22–32)
Calcium: 9.1 mg/dL (ref 8.9–10.3)
Calcium: 9 mg/dL (ref 8.9–10.3)
Calcium: 8.7 mg/dL — ABNORMAL LOW (ref 8.9–10.3)
Chloride: 87 mmol/L — ABNORMAL LOW (ref 101–111)
Chloride: 96 mmol/L — ABNORMAL LOW (ref 101–111)
Creatinine, Ser: 1.4 mg/dL — ABNORMAL HIGH (ref 0.61–1.24)
Creatinine, Ser: 1.24 mg/dL (ref 0.61–1.24)
Creatinine, Ser: 1 mg/dL (ref 0.61–1.24)
Creatinine, Ser: 1.01 mg/dL (ref 0.61–1.24)
GFR calc Af Amer: >60 mL/min (ref >60)
GFR calc Af Amer: >60 mL/min (ref >60)
GFR calc Af Amer: >60 mL/min (ref >60)
GFR calc Af Amer: >60 mL/min (ref >60)
GFR calc non Af Amer: >60 mL/min (ref >60)
GFR, EST NON AFRICAN AMERICAN: 60 mL/min — AB (ref >60)
GLUCOSE: 201 mg/dL — AB (ref 65–99)
GLUCOSE: 157 mg/dL — AB (ref 65–99)
Glucose, Bld: 712 mg/dL (ref 65–99)
Glucose, Bld: 404 mg/dL — ABNORMAL HIGH (ref 65–99)
POTASSIUM: 4 mmol/L (ref 3.5–5.1)
POTASSIUM: 3.7 mmol/L (ref 3.5–5.1)
POTASSIUM: 3.7 mmol/L (ref 3.5–5.1)
Potassium: 5.3 mmol/L — ABNORMAL HIGH (ref 3.5–5.1)
Sodium: 125 mmol/L — ABNORMAL LOW (ref 135–145)
Sodium: 133 mmol/L — ABNORMAL LOW (ref 135–145)
Sodium: 135 mmol/L (ref 135–145)
Sodium: 136 mmol/L (ref 135–145)

## 2016-08-02 LAB — I-STAT VENOUS BLOOD GAS, ED
Acid-Base Excess: 4 mmol/L — ABNORMAL HIGH (ref 0.0–2.0)
Bicarbonate: 27.3 mmol/L (ref 20.0–28.0)
O2 Saturation: 100 %
PO2 VEN: 219 mmHg — AB (ref 32.0–45.0)
TCO2: 28 mmol/L (ref 0–100)
pCO2, Ven: 37.4 mmHg — ABNORMAL LOW (ref 44.0–60.0)
pH, Ven: 7.472 — ABNORMAL HIGH (ref 7.250–7.430)

## 2016-08-02 LAB — CBC WITH DIFFERENTIAL/PLATELET
BASOS ABS: 0 10*3/uL (ref 0.0–0.1)
Basophils Relative: 0 %
Eosinophils Absolute: 0 10*3/uL (ref 0.0–0.7)
Eosinophils Relative: 0 %
HCT: 48.9 % (ref 39.0–52.0)
Hemoglobin: 17.5 g/dL — ABNORMAL HIGH (ref 13.0–17.0)
Lymphocytes Relative: 22 %
Lymphs Abs: 1.4 10*3/uL (ref 0.7–4.0)
MCH: 31.2 pg (ref 26.0–34.0)
MCHC: 35.8 g/dL (ref 30.0–36.0)
MCV: 87.2 fL (ref 78.0–100.0)
MONO ABS: 1 10*3/uL (ref 0.1–1.0)
Monocytes Relative: 16 %
Neutro Abs: 3.7 10*3/uL (ref 1.7–7.7)
Neutrophils Relative %: 62 %
PLATELETS: 190 10*3/uL (ref 150–400)
RBC: 5.61 MIL/uL (ref 4.22–5.81)
RDW: 12.3 % (ref 11.5–15.5)
WBC: 6.1 10*3/uL (ref 4.0–10.5)

## 2016-08-02 LAB — I-STAT CHEM 8, ED
BUN: 20 mg/dL (ref 6–20)
CREATININE: 0.9 mg/dL (ref 0.61–1.24)
Calcium, Ion: 1.02 mmol/L — ABNORMAL LOW (ref 1.15–1.40)
Chloride: 89 mmol/L — ABNORMAL LOW (ref 101–111)
Glucose, Bld: >700 mg/dL (ref 65–99)
HCT: 54 % — ABNORMAL HIGH (ref 39.0–52.0)
Hemoglobin: 18.4 g/dL — ABNORMAL HIGH (ref 13.0–17.0)
Potassium: 5.5 mmol/L — ABNORMAL HIGH (ref 3.5–5.1)
SODIUM: 127 mmol/L — AB (ref 135–145)
TCO2: 26 mmol/L (ref 0–100)

## 2016-08-02 LAB — URINALYSIS, ROUTINE W REFLEX MICROSCOPIC
BILIRUBIN URINE: NEGATIVE
Hgb urine dipstick: NEGATIVE
KETONES UR: 80 mg/dL — AB
LEUKOCYTES UA: NEGATIVE
Nitrite: NEGATIVE
Protein, ur: 30 mg/dL — AB
Specific Gravity, Urine: 1.031 — ABNORMAL HIGH (ref 1.005–1.030)
Squamous Epithelial / LPF: NONE SEEN
pH: 5 (ref 5.0–8.0)

## 2016-08-02 LAB — MRSA PCR SCREENING: MRSA BY PCR: NEGATIVE

## 2016-08-02 LAB — CBG MONITORING, ED
GLUCOSE-CAPILLARY: 260 mg/dL — AB (ref 65–99)
Glucose-Capillary: 394 mg/dL — ABNORMAL HIGH (ref 65–99)
Glucose-Capillary: 339 mg/dL — ABNORMAL HIGH (ref 65–99)

## 2016-08-02 LAB — MAGNESIUM: Magnesium: 2.2 mg/dL (ref 1.7–2.4)

## 2016-08-02 LAB — GLUCOSE, CAPILLARY
Glucose-Capillary: 213 mg/dL — ABNORMAL HIGH (ref 65–99)
Glucose-Capillary: 115 mg/dL — ABNORMAL HIGH (ref 65–99)
Glucose-Capillary: 127 mg/dL — ABNORMAL HIGH (ref 65–99)
Glucose-Capillary: 159 mg/dL — ABNORMAL HIGH (ref 65–99)
Glucose-Capillary: 179 mg/dL — ABNORMAL HIGH (ref 65–99)

## 2016-08-02 MED ORDER — ENOXAPARIN SODIUM 40 MG/0.4ML ~~LOC~~ SOLN
40.0000 mg | SUBCUTANEOUS | Status: DC
  Administered 2016-08-02: 40 mg via SUBCUTANEOUS
  Filled 2016-08-02 (×2): qty 0.4

## 2016-08-02 MED ORDER — INSULIN REGULAR BOLUS VIA INFUSION
0.0000 [IU] | Freq: Three times a day (TID) | INTRAVENOUS | Status: DC
  Filled 2016-08-02: qty 10

## 2016-08-02 MED ORDER — DEXTROSE 50 % IV SOLN: 25.0000 mL | INTRAVENOUS | Status: DC | PRN

## 2016-08-02 MED ORDER — INSULIN GLARGINE 100 UNIT/ML ~~LOC~~ SOLN
20.0000 [IU] | Freq: Every day | SUBCUTANEOUS | Status: DC
  Administered 2016-08-02: 20 [IU] via SUBCUTANEOUS
  Filled 2016-08-02 (×2): qty 0.2

## 2016-08-02 MED ORDER — DEXTROSE-NACL 5-0.45 % IV SOLN: INTRAVENOUS | Status: DC

## 2016-08-02 MED ORDER — SODIUM CHLORIDE 0.9 % IV SOLN
INTRAVENOUS | Status: AC
  Filled 2016-08-02: qty 2.5

## 2016-08-02 MED ORDER — SODIUM CHLORIDE 0.9 % IV SOLN
INTRAVENOUS | Status: DC
  Administered 2016-08-02: 14:00:00 via INTRAVENOUS

## 2016-08-02 MED ORDER — SODIUM CHLORIDE 0.9 % IV SOLN
INTRAVENOUS | Status: DC
  Administered 2016-08-02: 15:00:00 via INTRAVENOUS

## 2016-08-02 MED ORDER — INSULIN ASPART 100 UNIT/ML ~~LOC~~ SOLN
0.0000 [IU] | SUBCUTANEOUS | Status: DC
  Administered 2016-08-03: 3 [IU] via SUBCUTANEOUS
  Administered 2016-08-03: 2 [IU] via SUBCUTANEOUS
  Administered 2016-08-03: 7 [IU] via SUBCUTANEOUS
  Administered 2016-08-03: 3 [IU] via SUBCUTANEOUS

## 2016-08-02 MED ORDER — SODIUM CHLORIDE 0.9 % IV SOLN
1000.0000 mL | INTRAVENOUS | Status: DC
  Administered 2016-08-02: 1000 mL via INTRAVENOUS

## 2016-08-02 MED ORDER — SODIUM CHLORIDE 0.9 % IV SOLN
INTRAVENOUS | Status: DC
  Administered 2016-08-02: 5.4 [IU]/h via INTRAVENOUS
  Filled 2016-08-02: qty 2.5

## 2016-08-02 MED ORDER — POTASSIUM CHLORIDE IN NACL 20-0.9 MEQ/L-% IV SOLN
INTRAVENOUS | Status: DC
  Administered 2016-08-02 – 2016-08-03 (×2): via INTRAVENOUS
  Filled 2016-08-02 (×3): qty 1000

## 2016-08-02 MED ORDER — DEXTROSE-NACL 5-0.45 % IV SOLN
INTRAVENOUS | Status: AC
  Administered 2016-08-02: 22:00:00 via INTRAVENOUS

## 2016-08-02 MED ORDER — SODIUM CHLORIDE 0.9 % IV SOLN
1000.0000 mL | Freq: Once | INTRAVENOUS | Status: AC
  Administered 2016-08-02: 1000 mL via INTRAVENOUS

## 2016-08-02 NOTE — H&P (Signed)
Date: 08/02/2016               Patient Name:  Joshua [name] MRN: [id number]  DOB: 03/16/1971 Age / Sex: 46 y.o., male   PCP: Provider Not In System              Medical Service: Internal Medicine Teaching Service              Attending Physician: Dr. Tyson Aliasuncan Thomas Vincent, MD    First Contact: Alita ChyleJason Rochell Mabie, MS 4 Pager: [phone]  Second Contact: Dr. Valentino NoseNathan Boswell Pager: [phone]       After Hours (After 5p/  First Contact Pager: [phone]  weekends / holidays): Second Contact Pager: [phone]   Chief Complaint: Muscle cramps  History of Present Illness: Joshua [name] is a 46 year old male who presents to the ED with severe muscle cramps. He otherwise felt well and was in his normal state of health until Friday when he first noticed he had a very dry mouth and was excessively thirsty. He tried drinking lots of water but couldn't ever overcome his thirst. He noticed he started urinating large volumes frequency as well. Over the past 24 hours, the patient started to develop intermittent muscle cramps, soreness, fatigue and weakness. He also felt dizzy upon standing from a seated position. He otherwise has felt well. He denies recent fever, night sweats, chills, nausea, vomiting, chest pain, difficulty breathing, abdominal pain, diarrhea, constipation, pain with urination, frank blood with urination or bowel movements, and tingling/numbness in his hands and feet. He does endorse intermittent blurry vision and a dry, scratchy throat.  In the ED, patient was found to be hyperglycemic with glucose at 712. His Na was 125, K 5.3, Bicarb 18, anion gap 20 and Cre 1.4 from baseline of 0.91. VBG showed pCO2 of 37.4. The patient was given a 1L bolus of NS and started on an IV insulin infusion with NS for DKA.  Of note, this is Joshua [name]'s first presenting sign of diabetes.   Meds: Current Facility-Administered Medications  Medication Dose Route Frequency Provider Last Rate Last Dose  . 0.9 %   sodium chloride infusion   Intravenous Continuous Valentino NoseNathan Boswell, MD      . dextrose 5 %-0.45 % sodium chloride infusion   Intravenous Continuous Valentino NoseNathan Boswell, MD      . enoxaparin (LOVENOX) injection 40 mg  40 mg Subcutaneous Q24H Valentino NoseNathan Boswell, MD      . insulin regular (NOVOLIN R,HUMULIN R) 250 Units in sodium chloride 0.9 % 250 mL (1 Units/mL) infusion   Intravenous Continuous Valentino NoseNathan Boswell, MD       Current Outpatient Prescriptions  Medication Sig Dispense Refill  . hydrOXYzine (VISTARIL) 25 MG capsule Take 1 capsule (25 mg total) by mouth 3 (three) times daily as needed for anxiety. 30 capsule 0  . venlafaxine (EFFEXOR) 75 MG tablet Take 75 mg by mouth 2 (two) times daily.      Allergies: Allergies as of 08/02/2016  . (No Known Allergies)   Past Medical History:  Diagnosis Date  . Anxiety    takes Hydroxyzine  . Depression    has been on Effexor, not taking at this time   Past Surgical History:  Procedure Laterality Date  . ANTERIOR CRUCIATE LIGAMENT REPAIR Right 12/04/2013   Procedure: RECONSTRUCTION ANTERIOR CRUCIATE LIGAMENT (ACL) WITH HAMSTRING GRAFT, PARTIAL LATERAL MENISCECTOMY, POSSIBLE LCL/PLC RECONSTRUCTION.;  Surgeon: Cammy CopaGregory Scott Dean, MD;  Location: [facility] OR;  Service: Orthopedics;  Laterality: Right;  .  NO PAST SURGERIES     History reviewed. No pertinent family history. Social History   Social History  . Marital status: Single    Spouse name: N/A  . Number of children: N/A  . Years of education: N/A   Occupational History  . 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 Smoking status: Current Every Day Smoker    Packs/day: 0.20    Years: 26.00    Types: E-cigarettes    Last attempt to quit: 11/30/2010  . Smokeless tobacco: Never Used     Comment: Uses the vapes, quit cigarettes 3 years ago  . Alcohol use Yes     Comment: occasional  . Drug use: No  . Sexual activity: Yes   Other Topics Concern  . Not on file    Social History Narrative  . No narrative on file    Review of Systems: Pertinent items noted in HPI and remainder of comprehensive ROS otherwise negative.  Physical Exam: Blood pressure 145/99, pulse 95, temperature 98 F (36.7 C), temperature source Oral, resp. rate 18, SpO2 100 %. BP 145/99 (BP Location: Right Arm)   Pulse 95   Temp 98 F (36.7 C) (Oral)   Resp 18   SpO2 100%   General Appearance:    Alert, cooperative, appeared somewhat anxious and older than his stated age, muscular build  Head:    Normocephalic, without obvious abnormality, atraumatic  Eyes:    PERRL, conjunctiva/corneas clear, EOM's intact  Throat:   Lips, mucosa, and tongue normal; teeth and gums normal  Neck:   Supple, symmetrical, trachea midline, no adenopathy;       no JVD  Back:     Symmetric, no curvature, ROM normal  Lungs:     Clear to auscultation bilaterally, respirations unlabored  Chest wall:    No tenderness or deformity  Heart:    Regular rate and rhythm, S1 and S2 normal, no murmur, rub   or gallop  Abdomen:     Soft, non-tender, bowel sounds active all four quadrants,    no masses, no organomegaly  Extremities:   Extremities normal, atraumatic, no cyanosis or edema  Skin:   Skin dry, no rashes or lesions  Neurologic:   CNII-XII intact. 4+ strength in arms and legs, normal sensation throughout    Lab results: Basic Metabolic Panel:  Recent Labs  16/10/96 1157 08/02/16 1200  NA 127* 125*  K 5.5* 5.3*  CL 89* 87*  CO2  --  18*  GLUCOSE >700* 712*  BUN 20 15  CREATININE 0.90 1.40*  CALCIUM  --  9.5   CBC:  Recent Labs  08/02/16 1157 08/02/16 1200  WBC  --  6.1  NEUTROABS  --  3.7  HGB 18.4* 17.5*  HCT 54.0* 48.9  MCV  --  87.2  PLT  --  190   Imaging results:  No results found.  Assessment & Plan by Problem: Principal Problem:   Diabetic keto-acidosis (HCC)  Joshua [name] is a 46 year old male who presented with dry mouth, polydipsia, polyuria, a glucose of  712, Bicarb 18, and an AG of 20, consistent with DKA secondary to a new diagnosis of diabetes.  Diabetic keto-acidosis: Joshua [name] has new-onset diabetes and is presenting in DKA (Glu 712, Bicarb 18, and AG of 20). No clear precipitating event and patient has no family history of diabetes as far as he knows. He is being given IV  fluids liberally and started on an insulin infusion. His K was 5.3 on admission, so we will refrain from additional KCl and monitor BMPs over the next 24 hours. - s/p 1L NS bolus - Insulin infusion with 170mL/hr NS, switch to D5-1/2NS when CBG <250 - Repeat BMP q4H until gap closes x2, hold K for now - Strict I&Os - Mg, UA, C-peptide - Consult to diabetes coordinator  Possible hypertension: Spot blood pressure measurement on admission was 145/99, suggestive of possible hypertension. Will continue to monitor as DKA resolves.  Other: DVT prophylaxis: Lovenox Diet: NPO   This is a Psychologist, occupational Note.  The care of the patient was discussed with Dr. Karma Greaser and the assessment and plan was formulated with their assistance.  Please see their note for official documentation of the patient encounter.   Signed: Elmon Kirschner, Medical Student 08/02/2016, 2:36 PM

## 2016-08-02 NOTE — ED Notes (Signed)
Attempted IV X2, unsuccessful, IV team consult placed.

## 2016-08-02 NOTE — Progress Notes (Signed)
08/02/2016 Patient came from the emergency room at 1804 to 4East he is alert, oriented and ambulatory. Patient have generalized tattoes,  Bruise on right AC, callus on left great toe. He receive CHG bath and MRSA swab. Elink and Central monitor made aware patient was here. [facility] Adriann Ballweg RN.

## 2016-08-02 NOTE — H&P (Signed)
Date: 08/02/2016               Patient Name:  Joshua [name] MRN: [id number]  DOB: 17-Oct-1970 Age / Sex: 46 y.o., male   PCP: Provider Not In System         Medical Service: Internal Medicine Teaching Service         Attending Physician: Dr. Tyson Alias, MD    First Contact: Alita Chyle, MS4 Pager: [phone]  Second Contact: Dr. Valentino Nose Pager: [phone]       After Hours (After 5p/  First Contact Pager: [phone]  weekends / holidays): Second Contact Pager: [phone]   Chief Complaint: Thirst and cramps  History of Present Illness:  Mr. Durfee is a 46 year old male with a past medical history of depression and anxiety on no home medications presenting to the [facility] Emergency Department today with complaints of severe thirst, muscle cramps and frequent urination. He reports that 3 days ago he developed very dry mouth and large volume and frequent urination. He has been drinking water excessively due to his dry mouth and thirst but with no improvement. Yesterday he began having muscle cramps and soreness as well as fatigue and generalized weakness. Also notes he had nausea and vomiting yesterday but symptoms have since resolved.At one point he felt dizzy upon standing but denies any syncopal episodes or loss of consciousness. Does report some mild cough with scant clear sputum production and sore throat but attributes his sore throat to his mouth being dry. Denies any fevers, chills, diarrhea, chest pain, shortness of breath, abdominal pain, dysuria, hematuria. Does report some vision changes when he felt dizzy but otherwise denies any visual disturbances.   In the ED, he was found to be hyperglycemic to 712 with an anion gap of 20. He was given NS bolus and started on IV insulin infusion. No known history of diabetes in patient but does endorse strong family history of TDM2 in his family including his mother, maternal grandmother, and aunts and uncles on his mother's  side of the family.   Meds:  No outpatient prescriptions have been marked as taking for the 08/02/16 encounter [facility] Encounter).    Allergies: Allergies as of 08/02/2016  . (No Known Allergies)   Past Medical History:  Diagnosis Date  . Anxiety    takes Hydroxyzine  . Depression    has been on Effexor, not taking at this time  . Diabetes mellitus without complication [facility])    Past Surgical History:  Procedure Laterality Date  . ANTERIOR CRUCIATE LIGAMENT REPAIR Right 12/04/2013   Procedure: RECONSTRUCTION ANTERIOR CRUCIATE LIGAMENT (ACL) WITH HAMSTRING GRAFT, PARTIAL LATERAL MENISCECTOMY, POSSIBLE LCL/PLC RECONSTRUCTION.;  Surgeon: Cammy Copa, MD;  Location: [facility] OR;  Service: Orthopedics;  Laterality: Right;  . NO PAST SURGERIES      Family History:  Family History  Problem Relation Age of Onset  . Diabetes Mother   . Diabetes Maternal Grandmother     Social History:  Social History   Social History  . Marital status: Single    Spouse name: N/A  . Number of children: N/A  . Years of education: N/A   Occupational History  . 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 Smoking status: Current Every Day Smoker    Packs/day: 0.20    Years: 26.00    Types: E-cigarettes    Last attempt to quit: 11/30/2010  .  Smokeless tobacco: Never Used     Comment: Uses the vapes, quit cigarettes 3 years ago  . Alcohol use Yes     Comment: occasional  . Drug use: No  . Sexual activity: Yes   Other Topics Concern  . None   Social History Narrative  . None    Review of Systems: A complete ROS was negative except as per HPI.   Physical Exam: Blood pressure 127/85, pulse 77, temperature 97.5 F (36.4 C), temperature source Oral, resp. rate (!) 23, height 6' (1.829 m), weight 191 lb 2.2 oz (86.7 kg), SpO2 99 %. Physical Exam  Constitutional: He is oriented to person, place, and time and well-developed, well-nourished, and in no  distress. No distress.  HENT:  Head: Normocephalic and atraumatic.  Mouth/Throat: Oropharynx is clear and moist. No oropharyngeal exudate.  Eyes: Pupils are equal, round, and reactive to light.  Neck: No JVD present.  Cardiovascular: Normal rate, regular rhythm and normal heart sounds.   Pulmonary/Chest: Effort normal and breath sounds normal. No respiratory distress. He has no wheezes.  Abdominal: Soft. Bowel sounds are normal. He exhibits no distension. There is no tenderness.  Musculoskeletal: He exhibits no edema.  Lymphadenopathy:    He has no cervical adenopathy.  Neurological: He is alert and oriented to person, place, and time.  Skin: Skin is warm and dry.  Psychiatric: Mood and affect normal.   Assessment & Plan by Problem:  Diabetic Ketoacidosis with newly diagnosed DM: Patient presenting with CBG >700 and no known history of diabetes. Initial labs show an AG of 20 with >80 ketones in his urine. He was given boluses of NS in the ED and started on IV insulin drip. No clear cause for precipitation of his symptoms. Appears to have had nausea and vomiting but this began yesterday after his polyuria and polydipsia symptoms began and does not appear to be the inciting event. Does endorse cough and sore throat but denies any other symptoms. Will continue aggressive IVF and insulin gtt for now. Holding patient NPO. Trend BMET q4hr until gap closes x2. He is insulin naive so will give weight based lantus dosing when transitioning off insulin gtt. K 5.3 on admission, will hold K supplementation initially and supplement as needed with BMETs. Checking A1C. Patient has no PCP and will need follow up once discharge. Was agreeable to establishing his care with us in the internal medicine clinic after discharge.  -Continue NS 150 mL/hr, switch to D5 1/2NS when cbg < 250 -BMET q4hr until gap closes x2 -Hold K supplementation, follow bmets and supplement as appropriate -Checking C Peptide as unclear  diagnosis of type 1 vs type 2 -Consulting diabetes educator  -NPO  AKI:  Cr 1.40 on admission. Baseline appears to be 0.9. Will trend with bmets and aggressive IVF. Likely pre-renal. If no improvement will consider additional work up with urine studies and bladder ultrasound.   Psuedohyponatremia: Na 125 on arrival. Corrected Na 140 when adjusted for hyperglycemia.   Anxiety & Depression: Reports being off medications for >1 year. Denies any symptoms today.   DVT PPx: lovenox   Dispo: Admit patient to Inpatient with expected length of stay greater than 2 midnights.  Signed: Valentino NoseNathan Thinh Cuccaro, MD 08/02/2016, 9:30 PM  Pager: [phone]

## 2016-08-02 NOTE — ED Provider Notes (Signed)
[facility]-EMERGENCY DEPT Provider Note   CSN: [id number] Arrival date & time: 08/02/16  1103  By signing my name below, I, Majel Homer, attest that this documentation has been prepared under the direction and in the presence of Teressa Lower, NP . Electronically Signed: Majel Homer, Scribe. 08/02/2016. 11:32 AM.  History   Chief Complaint Chief Complaint  Patient presents with  . URI   The history is provided by the patient. No language interpreter was used.   HPI Comments: Joshua [name] is a 46 y.o. male who presents to the Emergency Department complaining of gradually worsening, cough and chest congestion that began ~3 days ago. Pt reports associated nausea and "cramping" in his abdomen, bilateral legs, and feet. He states he "feels like he is dehydrated" and notes he is drinking plenty of fluids "but vomiting it back up." He denies any fever.    Past Medical History:  Diagnosis Date  . Anxiety    takes Hydroxyzine  . Depression    has been on Effexor, not taking at this time    Patient Active Problem List   Diagnosis Date Noted  . ACL tear 12/04/2013    Past Surgical History:  Procedure Laterality Date  . ANTERIOR CRUCIATE LIGAMENT REPAIR Right 12/04/2013   Procedure: RECONSTRUCTION ANTERIOR CRUCIATE LIGAMENT (ACL) WITH HAMSTRING GRAFT, PARTIAL LATERAL MENISCECTOMY, POSSIBLE LCL/PLC RECONSTRUCTION.;  Surgeon: Cammy Copa, MD;  Location: [facility] OR;  Service: Orthopedics;  Laterality: Right;  . NO PAST SURGERIES      Home Medications    Prior to Admission medications   Medication Sig Start Date End Date Taking? Authorizing Provider  hydrOXYzine (VISTARIL) 25 MG capsule Take 1 capsule (25 mg total) by mouth 3 (three) times daily as needed for anxiety. 12/03/14   Marisa Severin, MD  venlafaxine (EFFEXOR) 75 MG tablet Take 75 mg by mouth 2 (two) times daily.    Historical Provider, MD    Family History History reviewed. No pertinent family history.  Social History Social  History  Substance Use Topics  . Smoking status: Current Every Day Smoker    Types: E-cigarettes    Last attempt to quit: 11/30/2010  . Smokeless tobacco: Never Used     Comment: Uses the vapes, quit cigarettes 3 years ago  . Alcohol use Yes     Comment: occasional   Allergies   Patient has no known allergies.  Review of Systems Review of Systems  Constitutional: Negative for fever.  HENT: Positive for congestion.   Respiratory: Positive for cough.   Gastrointestinal: Positive for nausea and vomiting.  Musculoskeletal: Positive for myalgias.   Physical Exam Updated Vital Signs BP 145/99 (BP Location: Right Arm)   Pulse 95   Temp 98 F (36.7 C) (Oral)   Resp 18   SpO2 100%   Physical Exam  Constitutional: He is oriented to person, place, and time. He appears well-developed and well-nourished.  HENT:  Head: Normocephalic.  Right Ear: External ear normal.  Eyes: EOM are normal.  Neck: Normal range of motion.  Cardiovascular: Normal rate and regular rhythm.   Pulmonary/Chest: Effort normal and breath sounds normal.  Abdominal: Soft. Bowel sounds are normal. He exhibits no distension.  Musculoskeletal: Normal range of motion.  Neurological: He is alert and oriented to person, place, and time.  Skin: Skin is warm.  Psychiatric: He has a normal mood and affect.  Nursing note and vitals reviewed.  ED Treatments / Results  DIAGNOSTIC STUDIES:  Oxygen Saturation is 100% on RA,  normal by my interpretation.    COORDINATION OF CARE:  11:32 AM Discussed treatment plan with pt at bedside and pt agreed to plan.  Labs (all labs ordered are listed, but only abnormal results are displayed) Labs Reviewed  CBC WITH DIFFERENTIAL/PLATELET - Abnormal; Notable for the following:       Result Value   Hemoglobin 17.5 (*)    All other components within normal limits  BASIC METABOLIC PANEL - Abnormal; Notable for the following:    Sodium 125 (*)    Potassium 5.3 (*)    Chloride 87  (*)    CO2 18 (*)    Glucose, Bld 712 (*)    Creatinine, Ser 1.40 (*)    GFR calc non Af Amer 60 (*)    Anion gap 20 (*)    All other components within normal limits  I-STAT CHEM 8, ED - Abnormal; Notable for the following:    Sodium 127 (*)    Potassium 5.5 (*)    Chloride 89 (*)    Glucose, Bld >700 (*)    Calcium, Ion 1.02 (*)    Hemoglobin 18.4 (*)    HCT 54.0 (*)    All other components within normal limits  I-STAT VENOUS BLOOD GAS, ED - Abnormal; Notable for the following:    pH, Ven 7.472 (*)    pCO2, Ven 37.4 (*)    pO2, Ven 219.0 (*)    Acid-Base Excess 4.0 (*)    All other components within normal limits  URINALYSIS, ROUTINE W REFLEX MICROSCOPIC    EKG  EKG Interpretation None       Radiology No results found.  Procedures Procedures (including critical care time)  Medications Ordered in ED Medications - No data to display  Initial Impression / Assessment and Plan / ED Course  I have reviewed the triage vital signs and the nursing notes.  Pertinent labs & imaging results that were available during my care of the patient were reviewed by me and considered in my medical decision making (see chart for details).   pt new diagnosis of diabetes. Gap noted. Pt to be admitted by teaching service  I personally performed the services described in this documentation, which was scribed in my presence. The recorded information has been reviewed and is accurate.   Final Clinical Impressions(s) / ED Diagnoses   Final diagnoses:  None    New Prescriptions New Prescriptions   No medications on file     Teressa LowerVrinda Chrys Landgrebe, NP 08/02/16 1338    Benjiman CoreNathan Doctor Sheahan, MD 08/02/16 1622

## 2016-08-02 NOTE — ED Notes (Signed)
ED Provider at bedside. 

## 2016-08-02 NOTE — ED Notes (Signed)
This RN attempted to site iv x2 unsuccessfully.

## 2016-08-02 NOTE — ED Notes (Signed)
Patient c/o excessive thirst and urination for the past few days, states his mouth feels very dry and he has bilateral wrist and ankle pain.

## 2016-08-02 NOTE — ED Triage Notes (Signed)
Pt sts URI sx x 3 days with cough

## 2016-08-02 NOTE — ED Notes (Signed)
Charge RN Melissa made aware that patient's acuity needed to be changed and that NP Pickering advised patient is not appropriate for fast track.

## 2016-08-02 NOTE — ED Notes (Signed)
Pt CBG was 260, notified Courtney(RN)

## 2016-08-03 ENCOUNTER — Telehealth: Payer: Self-pay | Admitting: Internal Medicine

## 2016-08-03 DIAGNOSIS — N179 Acute kidney failure, unspecified: Secondary | ICD-10-CM | POA: Diagnosis not present

## 2016-08-03 DIAGNOSIS — E1110 Type 2 diabetes mellitus with ketoacidosis without coma: Secondary | ICD-10-CM | POA: Diagnosis not present

## 2016-08-03 LAB — BASIC METABOLIC PANEL
Anion gap: 11 (ref 5–15)
Anion gap: 10 (ref 5–15)
Anion gap: 9 (ref 5–15)
BUN: 9 mg/dL (ref 6–20)
BUN: 10 mg/dL (ref 6–20)
BUN: 9 mg/dL (ref 6–20)
CALCIUM: 8.2 mg/dL — AB (ref 8.9–10.3)
CHLORIDE: 101 mmol/L (ref 101–111)
CHLORIDE: 102 mmol/L (ref 101–111)
CO2: 23 mmol/L (ref 22–32)
CO2: 22 mmol/L (ref 22–32)
CO2: 23 mmol/L (ref 22–32)
CREATININE: 0.89 mg/dL (ref 0.61–1.24)
CREATININE: 0.86 mg/dL (ref 0.61–1.24)
CREATININE: 0.86 mg/dL (ref 0.61–1.24)
Calcium: 8.1 mg/dL — ABNORMAL LOW (ref 8.9–10.3)
Calcium: 8.2 mg/dL — ABNORMAL LOW (ref 8.9–10.3)
Chloride: 99 mmol/L — ABNORMAL LOW (ref 101–111)
GFR calc Af Amer: >60 mL/min (ref >60)
GFR calc Af Amer: >60 mL/min (ref >60)
GFR calc non Af Amer: >60 mL/min (ref >60)
GFR calc non Af Amer: >60 mL/min (ref >60)
GFR calc non Af Amer: >60 mL/min (ref >60)
GLUCOSE: 306 mg/dL — AB (ref 65–99)
GLUCOSE: 308 mg/dL — AB (ref 65–99)
Glucose, Bld: 233 mg/dL — ABNORMAL HIGH (ref 65–99)
Potassium: 3.6 mmol/L (ref 3.5–5.1)
Potassium: 4.4 mmol/L (ref 3.5–5.1)
Potassium: 4.4 mmol/L (ref 3.5–5.1)
SODIUM: 133 mmol/L — AB (ref 135–145)
SODIUM: 133 mmol/L — AB (ref 135–145)
SODIUM: 134 mmol/L — AB (ref 135–145)

## 2016-08-03 LAB — HEMOGLOBIN A1C
HEMOGLOBIN A1C: 13.1 % — AB (ref 4.8–5.6)
MEAN PLASMA GLUCOSE: 329 mg/dL

## 2016-08-03 LAB — GLUCOSE, CAPILLARY
GLUCOSE-CAPILLARY: 211 mg/dL — AB (ref 65–99)
GLUCOSE-CAPILLARY: 213 mg/dL — AB (ref 65–99)
GLUCOSE-CAPILLARY: 221 mg/dL — AB (ref 65–99)
Glucose-Capillary: 250 mg/dL — ABNORMAL HIGH (ref 65–99)
Glucose-Capillary: 195 mg/dL — ABNORMAL HIGH (ref 65–99)
Glucose-Capillary: 323 mg/dL — ABNORMAL HIGH (ref 65–99)

## 2016-08-03 LAB — C-PEPTIDE: C-Peptide: 0.9 ng/mL — ABNORMAL LOW (ref 1.1–4.4)

## 2016-08-03 MED ORDER — LIVING WELL WITH DIABETES BOOK
Freq: Once | Status: AC
  Administered 2016-08-03: 12:00:00
  Filled 2016-08-03: qty 1

## 2016-08-03 MED ORDER — INSULIN ASPART 100 UNIT/ML FLEXPEN: PEN_INJECTOR | SUBCUTANEOUS | 11 refills | Status: DC

## 2016-08-03 MED ORDER — BLOOD GLUCOSE MONITOR KIT: PACK | 0 refills | Status: DC

## 2016-08-03 MED ORDER — LIVING WELL WITH DIABETES BOOK
Freq: Once | Status: AC
  Filled 2016-08-03: qty 1

## 2016-08-03 MED ORDER — INSULIN PEN NEEDLE 31G X 5 MM MISC: 2 refills | Status: DC

## 2016-08-03 MED ORDER — INSULIN GLARGINE 100 UNIT/ML SOLOSTAR PEN: 20.0000 [IU] | PEN_INJECTOR | Freq: Every day | SUBCUTANEOUS | 1 refills | Status: DC

## 2016-08-03 MED ORDER — INSULIN STARTER KIT- SYRINGES (ENGLISH)
1.0000 | Freq: Once | Status: AC
  Administered 2016-08-03: 1
  Filled 2016-08-03: qty 1

## 2016-08-03 MED ORDER — INSULIN ASPART 100 UNIT/ML ~~LOC~~ SOLN
7.0000 [IU] | Freq: Three times a day (TID) | SUBCUTANEOUS | Status: DC
  Administered 2016-08-03: 7 [IU] via SUBCUTANEOUS

## 2016-08-03 NOTE — Progress Notes (Signed)
Subjective: Joshua [name] is doing well this morning. His dry mouth and leg cramping has resolved. He feels much better but hasn't tried eating or walking around yet.   Objective: Vital signs in last 24 hours: Vitals:   08/02/16 1913 08/03/16 0000 08/03/16 0400 08/03/16 0800  BP: 127/85 107/69 108/71 109/73  Pulse: 77 (!) 59 (!) 55 (!) 53  Resp: (!) 23 11 16 14   Temp: 97.5 F (36.4 C) 98 F (36.7 C) 97.9 F (36.6 C) 97.9 F (36.6 C)  TempSrc: Oral Oral Oral Oral  SpO2: 99% 99% 98% 99%  Weight:      Height:       Weight change:   Intake/Output Summary (Last 24 hours) at 08/03/16 0906 Last data filed at 08/03/16 0700  Gross per 24 hour  Intake             1860 ml  Output             1050 ml  Net              810 ml   BP 109/73   Pulse (!) 53   Temp 97.9 F (36.6 C) (Oral)   Resp 14   Ht 6' (1.829 m)   Wt 86.7 kg (191 lb 2.2 oz)   SpO2 99%   BMI 25.92 kg/m   General Appearance:    Alert, cooperative, no distress  Lungs:     Clear to auscultation bilaterally, respirations unlabored  Heart:    Regular rate and rhythm, S1 and S2 normal, no murmur, rub   or gallop  Abdomen:     Soft, non-tender, bowel sounds active all four quadrants,    no masses, no organomegaly  Extremities:   Extremities normal, atraumatic, no cyanosis or edema   Lab Results: Basic Metabolic Panel:  Recent Labs Lab 08/02/16 1530  08/02/16 2214 08/03/16 0251  NA 133*  < > 136 133*  K 4.0  < > 3.7 3.6  CL 96*  < > 102 99*  CO2 22  < > 22 23  GLUCOSE 404*  < > 157* 233*  BUN 13  < > 10 9  CREATININE 1.24  < > 1.01 0.89  CALCIUM 9.1  < > 8.7* 8.2*  MG 2.2  --   --   --   < > = values in this interval not displayed.   Recent Labs  08/03/16 0251  ANIONGAP 11    CBC:  Recent Labs Lab 08/02/16 1157 08/02/16 1200  WBC  --  6.1  NEUTROABS  --  3.7  HGB 18.4* 17.5*  HCT 54.0* 48.9  MCV  --  87.2  PLT  --  190   CBG:  Recent Labs Lab 08/02/16 2322 08/03/16 0028  08/03/16 0131 08/03/16 0318 08/03/16 0453 08/03/16 0847  GLUCAP 179* 211* 213* 250* 221* 195*    Studies/Results: No results found. Medications: I have reviewed the patient's current medications. Scheduled Meds: . enoxaparin (LOVENOX) injection  40 mg Subcutaneous Q24H  . insulin aspart  0-9 Units Subcutaneous Q4H  . insulin glargine  20 Units Subcutaneous QHS   Continuous Infusions: . 0.9 % NaCl with KCl 20 mEq / L 150 mL/hr at 08/03/16 0141   PRN Meds:. Assessment/Plan: Principal Problem:   Diabetic keto-acidosis (HCC) Active Problems:   Acute kidney injury (HCC)  Diabetic ketoacidosis with newly diagnosed diabetes: AG closed to 11 overnight, patient is symptomatically improved. Transitioned from insulin gtt to q4+qHS, received 20U glargine  overnight. Will encourage PO intake and activity today. - C peptide and anti-GAD antibody pending - Continue sliding scale insulin with meals - Will discharge home on insulin - [facility] regimen   AKI: Creatinine 1.4 on admission. Back to baseline of 0.9 this morning. Resolved  Anxiety and Depression: Reports being off medications for >884yr, denies any symptoms.  DVT PPx: Lovenox Diet: Carb modified diet  Dispo: If patient does well with food and ambulation today, will d/c this afternoon.   This is a Psychologist, occupationalMedical Student Note.  The care of the patient was discussed with Dr. Karma GreaserBoswell and the assessment and plan formulated with their assistance.  Please see their attached note for official documentation of the daily encounter.   LOS: 1 day   Elmon KirschnerJason H Onnika Siebel, Medical Student 08/03/2016, 9:06 AM

## 2016-08-03 NOTE — Progress Notes (Signed)
Discharge information completed with patient and mother. Both verbalized understanding. Rx given for glucose meter.

## 2016-08-03 NOTE — Discharge Instructions (Signed)
Joshua [name], you presented to the hospital with excessively dry mouth, frequent urination and muscle cramps. You were found to have diabetes and were in diabetic keto-acidosis, a problem that occurs when your body doesn't have enough insulin. We treated you for this with intravenous fluids and insulin and your symptoms improved. We still don't know whether you have Type 1 Diabetes (insulin-deficient) or Type 2 Diabetes (insulin-resistant). Until we know, we will be prescribing you insulin to take at home. You will have two insulin pens, a short acting one called insulin aspart to take prior to meals and a long acting one called insulin glargine to take before bed. If you skip a meal, do not take your mealtime insulin. We also are prescribing you a glucometer, strips and lancets so that you can check your blood sugar at home. Please check you blood sugar 4 times a day, before each meal and before bed and keep a log of these numbers to bring in to your follow up appointment.  If you start having dry mouth, frequent urination and muscle cramps again, please call our clinic as you may need adjustments to your insulin.  Sometimes with insulin, your blood sugar can drop too low. We have included information below about low blood sugar (hypoglycemia). Symptoms of hypoglycemia include nausea, dizziness, lightheadedness, sweats, and feeling faint. If your blood sugar drops low, drink a small glass of juice or eat some hard candies to raise your blood sugar.   Blood Glucose Monitoring, Adult Monitoring your blood sugar (glucose) helps you manage your diabetes. It also helps you and your health care provider determine how well your diabetes management plan is working. Blood glucose monitoring involves checking your blood glucose as often as directed, and keeping a record (log) of your results over time. Why should I monitor my blood glucose? Checking your blood glucose regularly can:  Help you understand how food,  exercise, illnesses, and medicines affect your blood glucose.  Let you know what your blood glucose is at any time. You can quickly tell if you are having low blood glucose (hypoglycemia) or high blood glucose (hyperglycemia).  Help you and your health care provider adjust your medicines as needed. When should I check my blood glucose? Follow instructions from your health care provider about how often to check your blood glucose. This may depend on:  The type of diabetes you have.  How well-controlled your diabetes is.  Medicines you are taking. If you have type 1 diabetes:  Check your blood glucose at least 2 times a day.  Also check your blood glucose:  Before every insulin injection.  Before and after exercise.  Between meals.  2 hours after a meal.  Occasionally between 2:00 a.m. and 3:00 a.m., as directed.  Before potentially dangerous tasks, like driving or using heavy machinery.  At bedtime.  You may need to check your blood glucose more often, up to 6-10 times a day:  If you use an insulin pump.  If you need multiple daily injections (MDI).  If your diabetes is not well-controlled.  If you are ill.  If you have a history of severe hypoglycemia.  If you have a history of not knowing when your blood glucose is getting low (hypoglycemia unawareness). If you have type 2 diabetes:  If you take insulin or other diabetes medicines, check your blood glucose at least 2 times a day.  If you are on intensive insulin therapy, check your blood glucose at least 4 times a  day. Occasionally, you may also need to check between 2:00 a.m. and 3:00 a.m., as directed.  Also check your blood glucose:  Before and after exercise.  Before potentially dangerous tasks, like driving or using heavy machinery.  You may need to check your blood glucose more often if:  Your medicine is being adjusted.  Your diabetes is not well-controlled.  You are ill. What is a blood  glucose log?  A blood glucose log is a record of your blood glucose readings. It helps you and your health care provider:  Look for patterns in your blood glucose over time.  Adjust your diabetes management plan as needed.  Every time you check your blood glucose, write down your result and notes about things that may be affecting your blood glucose, such as your diet and exercise for the day.  Most glucose meters store a record of glucose readings in the meter. Some meters allow you to download your records to a computer. How do I check my blood glucose? Follow these steps to get accurate readings of your blood glucose: Supplies needed   Blood glucose meter.  Test strips for your meter. Each meter has its own strips. You must use the strips that come with your meter.  A needle to prick your finger (lancet). Do not use lancets more than once.  A device that holds the lancet (lancing device).  A journal or log book to write down your results. Procedure  Wash your hands with soap and water.  Prick the side of your finger (not the tip) with the lancet. Use a different finger each time.  Gently rub the finger until a small drop of blood appears.  Follow instructions that come with your meter for inserting the test strip, applying blood to the strip, and using your blood glucose meter.  Write down your result and any notes. Alternative testing sites  Some meters allow you to use areas of your body other than your finger (alternative sites) to test your blood.  If you think you may have hypoglycemia, or if you have hypoglycemia unawareness, do not use alternative sites. Use your finger instead.  Alternative sites may not be as accurate as the fingers, because blood flow is slower in these areas. This means that the result you get may be delayed, and it may be different from the result that you would get from your finger.  The most common alternative sites  are:  Forearm.  Thigh.  Palm of the hand. Additional tips  Always keep your supplies with you.  If you have questions or need help, all blood glucose meters have a 24-hour hotline number that you can call. You may also contact your health care provider.  After you use a few boxes of test strips, adjust (calibrate) your blood glucose meter by following instructions that came with your meter. This information is not intended to replace advice given to you by your health care provider. Make sure you discuss any questions you have with your health care provider. Document Released: 06/10/2003 Document Revised: 12/26/2015 Document Reviewed: 11/17/2015 Elsevier Interactive Patient Education  2017 [employer].   Diabetes Mellitus and Standards of Medical Care Managing diabetes (diabetes mellitus) can be complicated. Your diabetes treatment may be managed by a team of health care providers, including:  A diet and nutrition specialist (registered dietitian).  A nurse.  A certified diabetes educator (CDE).  A diabetes specialist (endocrinologist).  An eye doctor.  A primary care provider.  A dentist. Your health care providers follow a schedule in order to help you get the best quality of care. The following schedule is a general guideline for your diabetes management plan. Your health care providers may also give you more specific instructions. HbA1c ( hemoglobin A1c) test This test provides information about blood sugar (glucose) control over the previous 2-3 months. It is used to check whether your diabetes management plan needs to be adjusted.  If you are meeting your treatment goals, this test is done at least 2 times a year.  If you are not meeting treatment goals or if your treatment goals have changed, this test is done 4 times a year. Blood pressure test  This test is done at every routine medical visit. For most people, the goal is less than 140/90. In some cases, your  goal blood pressure may be 130/80 or less. Ask your health care provider what your goal blood pressure should be. Dental and eye exams  Visit your dentist two times a year.  If you have type 1 diabetes, get an eye exam 3-5 years after you are diagnosed, and then once a year after your first exam.  If you were diagnosed with type 1 diabetes as a child, get an eye exam when you are age 50 or older and have had diabetes for 3-5 years. After the first exam, you should get an eye exam once a year.  If you have type 2 diabetes, have an eye exam as soon as you are diagnosed, and then once a year after your first exam. Foot care exam  Visual foot exams are done at every routine medical visit. The exams check for cuts, bruises, redness, blisters, sores, or other problems with the feet.  A complete foot exam is done by your health care provider once a year. This exam includes an inspection of the structure and skin of your feet, and a check of the pulses and sensation in your feet.  Type 1 diabetes: Get your first exam 3-5 years after diagnosis.  Type 2 diabetes: Get your first exam as soon as you are diagnosed.  Check your feet every day for cuts, bruises, redness, blisters, or sores. If you have any of these or other problems that are not healing, contact your health care provider. Kidney function test ( urine microalbumin)  This test is done once a year.  Type 1 diabetes: Get your first test 5 years after diagnosis.  Type 2 diabetes: Get your first test as soon as you are diagnosed.  If you have chronic kidney disease (CKD), get a serum creatinine and estimated glomerular filtration rate (eGFR) test once a year. Lipid profile (cholesterol, HDL, LDL, triglycerides)  This test should be done when you are diagnosed with diabetes, and every 5 years after the first test. If you are on medicines to lower your cholesterol, you may need to get this test done every year.  The goal for LDL is less  than 100 mg/dL (5.5 mmol/L). If you are at high risk, the goal is less than 70 mg/dL (3.9 mmol/L).  The goal for HDL is 40 mg/dL (2.2 mmol/L) for men and 50 mg/dL(2.8 mmol/L) for women. An HDL cholesterol of 60 mg/dL (3.3 mmol/L) or higher gives some protection against heart disease.  The goal for triglycerides is less than 150 mg/dL (8.3 mmol/L). Immunizations  The yearly flu (influenza) vaccine is recommended for everyone 6 months or older who has diabetes.  The pneumonia (pneumococcal) vaccine  is recommended for everyone 2 years or older who has diabetes. If you are 89 or older, you may get the pneumonia vaccine as a series of two separate shots.  The hepatitis B vaccine is recommended for adults shortly after they have been diagnosed with diabetes.  The Tdap (tetanus, diphtheria, and pertussis) vaccine should be given:  According to normal childhood vaccination schedules, for children.  Every 10 years, for adults who have diabetes.  The shingles vaccine is recommended for people who have had chicken pox and are 50 years or older. Mental and emotional health  Screening for symptoms of eating disorders, anxiety, and depression is recommended at the time of diagnosis and afterward as needed. If your screening shows that you have symptoms (you have a positive screening result), you may need further evaluation and be referred to a mental health care provider. Diabetes self-management education  Education about how to manage your diabetes is recommended at diagnosis and ongoing as needed. Treatment plan  Your treatment plan will be reviewed at every medical visit. Summary  Managing diabetes (diabetes mellitus) can be complicated. Your diabetes treatment may be managed by a team of health care providers.  Your health care providers follow a schedule in order to help you get the best quality of care.  Standards of care including having regular physical exams, blood tests, blood  pressure monitoring, immunizations, screening tests, and education about how to manage your diabetes.  Your health care providers may also give you more specific instructions based on your individual health. This information is not intended to replace advice given to you by your health care provider. Make sure you discuss any questions you have with your health care provider. Document Released: 04/04/2009 Document Revised: 03/05/2016 Document Reviewed: 03/05/2016 Elsevier Interactive Patient Education  2017 [employer]     Hypoglycemia  Hypoglycemia is when the sugar (glucose) level in the blood is too low. Symptoms of low blood sugar may include:  Feeling:  Hungry.  Worried or nervous (anxious).  Sweaty and clammy.  Confused.  Dizzy.  Sleepy.  Sick to your stomach (nauseous).  Having:  A fast heartbeat.  A headache.  A change in your vision.  Jerky movements that you cannot control (seizure).  Nightmares.  Tingling or no feeling (numbness) around the mouth, lips, or tongue.  Having trouble with:  Talking.  Paying attention (concentrating).  Moving (coordination).  Sleeping.  Shaking.  Passing out (fainting).  Getting upset easily (irritability). Low blood sugar can happen to people who have diabetes and people who do not have diabetes. Low blood sugar can happen quickly, and it can be an emergency. Treating Low Blood Sugar  Low blood sugar is often treated by eating or drinking something sugary right away. If you can think clearly and swallow safely, follow the 15:15 rule:  Take 15 grams of a fast-acting carb (carbohydrate). Some fast-acting carbs are:  1 tube of glucose gel.  3 sugar tablets (glucose pills).  6-8 pieces of hard candy.  4 oz (120 mL) of fruit juice.  4 oz (120 mL) of regular (not diet) soda.  Check your blood sugar 15 minutes after you take the carb.  If your blood sugar is still at or below 70 mg/dL (3.9 mmol/L), take  15 grams of a carb again.  If your blood sugar does not go above 70 mg/dL (3.9 mmol/L) after 3 tries, get help right away.  After your blood sugar goes back to normal, eat a meal or  a snack within 1 hour. Treating Very Low Blood Sugar  If your blood sugar is at or below 54 mg/dL (3 mmol/L), you have very low blood sugar (severe hypoglycemia). This is an emergency. Do not wait to see if the symptoms will go away. Get medical help right away. Call your local emergency services (911 in the [place] Do not drive yourself to the hospital. If you have very low blood sugar and you cannot eat or drink, you may need a glucagon shot (injection). A family member or friend should learn how to check your blood sugar and how to give you a glucagon shot. Ask your doctor if you need to have a glucagon shot kit at home. Follow these instructions at home: General instructions  Avoid any diets that cause you to not eat enough food. Talk with your doctor before you start any new diet.  Take over-the-counter and prescription medicines only as told by your doctor.  Limit alcohol to no more than 1 drink per day for nonpregnant women and 2 drinks per day for men. One drink equals 12 oz of beer, 5 oz of wine, or 1 oz of hard liquor.  Keep all follow-up visits as told by your doctor. This is important. If You Have Diabetes:   Make sure you know the symptoms of low blood sugar.  Always keep a source of sugar with you, such as:  Sugar.  Sugar tablets.  Glucose gel.  Fruit juice.  Regular soda (not diet soda).  Milk.  Hard candy.  Honey.  Take your medicines as told.  Follow your exercise and meal plan.  Eat on time. Do not skip meals.  Follow your sick day plan when you cannot eat or drink normally. Make this plan ahead of time with your doctor.  Check your blood sugar as often as told by your doctor. Always check before and after exercise.  Share your diabetes care plan with:  Your work or  school.  People you live with.  Check your pee (urine) for ketones:  When you are sick.  As told by your doctor.  Carry a card or wear jewelry that says you have diabetes. If You Have Low Blood Sugar From Other Causes:   Check your blood sugar as often as told by your doctor.  Follow instructions from your doctor about what you cannot eat or drink. Contact a doctor if:  You have trouble keeping your blood sugar in your target range.  You have low blood sugar often. Get help right away if:  You still have symptoms after you eat or drink something sugary.  Your blood sugar is at or below 54 mg/dL (3 mmol/L).  You have jerky movements that you cannot control.  You pass out. These symptoms may be an emergency. Do not wait to see if the symptoms will go away. Get medical help right away. Call your local emergency services (911 in the [place] Do not drive yourself to the hospital.  This information is not intended to replace advice given to you by your health care provider. Make sure you discuss any questions you have with your health care provider. Document Released: 09/01/2009 Document Revised: 11/13/2015 Document Reviewed: 07/11/2015 Elsevier Interactive Patient Education  2017 [employer].

## 2016-08-03 NOTE — Progress Notes (Signed)
  Demonstrated use of insulin pen.  Patient returned demonstration and teach back done.  Also gave patient insulin starter kit. 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   Thanks, Adah Perl, RN, BC-ADM Inpatient Diabetes Coordinator Pager [phone]

## 2016-08-03 NOTE — Progress Notes (Signed)
Inpatient Diabetes Program Recommendations  AACE/ADA: New Consensus Statement on Inpatient Glycemic Control (2015)  Target Ranges:  Prepandial:   less than 140 mg/dL      Peak postprandial:   less than 180 mg/dL (1-2 hours)      Critically ill patients:  140 - 180 mg/dL   Lab Results  Component Value Date   GLUCAP 323 (H) 08/03/2016   HGBA1C 13.1 (H) 08/02/2016    Review of Glycemic Control:  Results for Joshua [name], Joshua [name] (MRN [id number]) as of 08/03/2016 15:03  Ref. Range 08/03/2016 01:31 08/03/2016 03:18 08/03/2016 04:53 08/03/2016 08:47 08/03/2016 12:06  Glucose-Capillary Latest Ref Range: 65 - 99 mg/dL 130213 (H) 865250 (H) 784221 (H) 195 (H) 323 (H)    Diabetes history: New onset diabetes Outpatient Diabetes medications: None Current orders for Inpatient glycemic control:  Lantus 20 units q HS, Novolog 7 units tid with meals, Novolog sensitive tid with meals  Inpatient Diabetes Program Recommendations:    Note that patient has new onset diabetes.  A1C is markedly elevated.  Patient reports dry mouth for the past week.  He admits to drinking mostly juices and sodas.  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  Patient is very concerned about being on insulin due to his occupation.  Mother reports that there is a strong family history of Type 2 diabetes and she currently takes PO meds for diabetes.  Discussed need for insulin at this time and the importance of follow-up with PCP and possibly endocrinologist?  Note that [facility] with both GAD and c-peptide pending.    Discussed survival skills of diabetes including monitoring, normal glucose levels, hyper and hypoglycemia, treatment of hypoglycemia (15/15/ rule), prevention of complications, and A1C goals.  Patient seems overwhelmed and will need close follow-up outpatient for continued education.  Family seemed knowledgeable of information presented and seems to be a strong support for patient.    Paged MD to discuss.  Note that patient will d/c on insulin pen.  Will demonstrate use to patient prior to d/c.   Thanks, Beryl MeagerJenny Korey Arroyo, RN, BC-ADM Inpatient Diabetes Coordinator Pager [phone] (8a-5p)

## 2016-08-03 NOTE — Care Management Note (Signed)
Case Management Note  Patient Details  Name: Joshua DankerRobert E [name] MRN: [id number] Date of Birth: 03/25/1971  Subjective/Objective:   Pt is newly diagnosed diabetic, has no PCP, states he [facility].  He requests information re diet - provided pt with written material to include carb counting and meal plans.          Expected Discharge Plan:  Home/Self Care  Discharge planning Services  CM Consult  Status of Service:  Completed, signed off  Magdalene RiverMayo, Mykai Wendorf T, [place]RN 08/03/2016, 2:49 PM

## 2016-08-03 NOTE — Progress Notes (Signed)
   Subjective: [name] is doing much better this morning. Reports his dry mouth and leg cramps have resolved today. He has not been eating anything yet since coming off the insulin drip and has not been out of bed today.   Objective: Vital signs in last 24 hours: Vitals:   08/02/16 1913 08/03/16 0000 08/03/16 0400 08/03/16 0800  BP: 127/85 107/69 108/71 109/73  Pulse: 77 (!) 59 (!) 55 (!) 53  Resp: (!) 23 11 16 14   Temp: 97.5 F (36.4 C) 98 F (36.7 C) 97.9 F (36.6 C) 97.9 F (36.6 C)  TempSrc: Oral Oral Oral Oral  SpO2: 99% 99% 98% 99%  Weight:      Height:       Weight change:   Intake/Output Summary (Last 24 hours) at 08/03/16 1003 Last data filed at 08/03/16 0700  Gross per 24 hour  Intake             1860 ml  Output             1050 ml  Net              810 ml    Physical Exam  Constitutional: He is well-developed, well-nourished, and in no distress. No distress.  Cardiovascular: Normal rate, regular rhythm and normal heart sounds.   Pulmonary/Chest: Effort normal and breath sounds normal.  Abdominal: Soft. Bowel sounds are normal. He exhibits no distension. There is no tenderness. There is no rebound.  Musculoskeletal: He exhibits no edema.  Skin: Skin is warm and dry.   Medications: I have reviewed the patient's current medications. Scheduled Meds: . enoxaparin (LOVENOX) injection  40 mg Subcutaneous Q24H  . insulin aspart  0-9 Units Subcutaneous Q4H  . insulin glargine  20 Units Subcutaneous QHS   Continuous Infusions: PRN Meds:. Assessment/Plan:  Diabetic Ketoacidosis with newly diagnosed DM: Gap closed x2 overnight and was transitioned off the insulin gtt and started on Lantus 20 units qhs and SSI-s. No clear cause of his DKA apparent currently but symptoms have resolved. Diagnosis of Type 1 vs Type 2 remains unclear, checking GAD auto antibodies and C-Peptide as well. Will continue insulin at weight based dosing with Lantus 20 units qhs and Novolog 7  units qac. Discussed with him the insulin regimen and checking his blood sugars. [facility]. Once Type 1 vs Type 2 is clarified can continue to adjust his medication regimen. If able to eat and drink without issue and ambulating, anticipate discharge later this afternoon.  -Follow up C-Peptide and GAD auto antibody -Continue Lantus 20 units qhs, start Novolog 7 units qac with SSI-s -CBG checks qac & qhs -DM educator  AKI:  Resolved. Cr 1.40 > 0.9 this morning. Likely pre-renal in the setting of dehydration with DKA.   Anxiety & Depression: Reported being off medications for >1 year. Follow up outpatient.   DVT PPx: Lovenox   Dispo: Anticipate discharge later today   The patient does not have a current PCP (Provider Not In System) and [facility] after discharge.  The patient does not have transportation limitations that hinder transportation to clinic appointments.  .Services Needed at time of discharge: Y = Yes, Blank = No PT:   OT:   RN:   Equipment:   Other:     LOS: 1 day   Valentino NoseNathan Filiberto Wamble, MD IMTS PGY-2 [phone] 08/03/2016, 10:03 AM

## 2016-08-03 NOTE — Telephone Encounter (Signed)
HFU on 08/10/2016 @ 2:45pm with the [facility] per Dr. Karma GreaserBoswell

## 2016-08-03 NOTE — Discharge Summary (Signed)
Name: Joshua [name] MRN: [id number] DOB: 06/24/1970 46 y.o. PCP: Provider Not In System  Date of Admission: 08/02/2016 11:21 AM Date of Discharge: 08/03/2016 Attending Physician: Joshua Filler, MD  Discharge Diagnosis: 1. Diabetic keto-acidosis secondary to new-onset diabetes mellitus 2. Acute kidney injury  Discharge Medications: Allergies as of 08/03/2016   No Known Allergies     Medication List    TAKE these medications   blood glucose meter kit and supplies Kit Dispense based on patient and insurance preference. Use up to four times daily as directed. (FOR ICD-9 250.00, 250.01).   hydrOXYzine 25 MG capsule Commonly known as:  VISTARIL Take 1 capsule (25 mg total) by mouth 3 (three) times daily as needed for anxiety.   insulin aspart 100 UNIT/ML FlexPen Commonly known as:  NOVOLOG FLEXPEN Inject 7 units of novolog before each meal.   Insulin Glargine 100 UNIT/ML Solostar Pen Commonly known as:  LANTUS Inject 20 Units into the skin daily at 10 pm.   Insulin Pen Needle 31G X 5 MM Misc Commonly known as:  B-D UF III MINI PEN NEEDLES Use to inject novolog and lantus.   venlafaxine 75 MG tablet Commonly known as:  EFFEXOR Take 75 mg by mouth 2 (two) times daily.       Disposition and follow-up:   Joshua [name] was discharged from [facility] in Stable condition.  At the hospital follow up visit please address:  1.  New-onset diabetes, unclear if Type 1 or 2. 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 Will likely need referral to endocrinologist if diagnosis remains unclear and will stay on insulin. Diabetes education and routine health maintenance.  2.  Labs / imaging needed at time of follow-up: Routine diabetes health maintenance, consider additional auto antibody testing and fasting C-Peptide   3.  Pending labs/ test needing follow-up: anti-GAD antibody  Follow-up  Appointments: Follow up at the [facility] on February 20th at [facility] [place] by problem list: Principal Problem:   Diabetic keto-acidosis ([place]) Active Problems:   Acute kidney injury ([place])   1. Diabetic ketoacidosis secondary to diabetes mellitus Patient presented with dry mouth, frequent urination, and muscle cramps. He was found to have DKA with anion gap of 28 and glucose of 712. He responded well to fluids and insulin infusion, then transitioned to SQ insulin. On day of discharge, he was eating and drinking normally. Diagnosis of type 1 vs type 2 remains unclear. His C-Peptide (non-fasting) was mildly suppressed at 0.9 (ref range 1.1-4.4). This can be falsely suppressed in acute hyperglycemia. Did send off GAD antibodies that are still pending at time of discharge. A1C 13.1. He was discharged on Novolog 7U qAC and Lantus 20U qHS. 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 Will likely need referral to endocrinology at follow up.   2. Acute kidney injury Patient had creatinine elevated to 1.4 on admission, which responded to fluids and returned to 0.89, his baseline.  Discharge Vitals:   BP 109/73   Pulse (!) 53   Temp 97.8 F (36.6 C) (Oral)   Resp 14   Ht 6' (1.829 m)   Wt 191 lb 2.2 oz (86.7 kg)   SpO2 97%   BMI 25.92 kg/m   Pertinent Labs, Studies, and Procedures:  Admission: K 5.3, CO2 18, Glucose 712, AG 20  Discharge: K 3.6, CO2 23, Glucose 233, AG 11 A1C 13.1  Discharge Instructions: Discharge Instructions    Ambulatory referral to Nutrition and Diabetic Education    Complete by:  As directed    Discharge instructions    Complete by:  As directed    Please take your insulin as instructed. Measure your blood sugar 4 times a day (before each meals and then also at bed time).  Take your glucose monitoring device with you to all of your appointments.    Joshua [name], you  presented to the hospital with excessively dry mouth, frequent urination and muscle cramps. You were found to have diabetes and were in diabetic keto-acidosis, a problem that occurs when your body doesn't have enough insulin. We treated you for this with intravenous fluids and insulin and your symptoms improved. We still don't know whether you have Type 1 Diabetes (insulin-deficient) or Type 2 Diabetes (insulin-resistant). Until we know, we will be prescribing you insulin to take at home. You will have two insulin pens, a short acting one called insulin aspart to take prior to meals and a long acting one called insulin glargine to take before bed. If you skip a meal, do not take your mealtime insulin. We also are prescribing you a glucometer, strips and lancets so that you can check your blood sugar at home. Please check you blood sugar 4 times a day, before each meal and before bed and keep a log of these numbers to bring in to your follow up appointment.  If you start having dry mouth, frequent urination and muscle cramps again, please call our clinic as you may need adjustments to your insulin.  Sometimes with insulin, your blood sugar can drop too low. We have included information below about low blood sugar (hypoglycemia). Symptoms of hypoglycemia include nausea, dizziness, lightheadedness, sweats, and feeling faint. If your blood sugar drops low, drink a small glass of juice or eat some hard candies to raise your blood sugar.  Signed: Maryellen Pile, MD 08/04/2016, 7:40 AM

## 2016-08-04 LAB — GLUTAMIC ACID DECARBOXYLASE AUTO ABS: Glutamic Acid Decarb Ab: <5 U/mL (ref 0.0–5.0)

## 2016-08-06 ENCOUNTER — Ambulatory Visit (INDEPENDENT_AMBULATORY_CARE_PROVIDER_SITE_OTHER): Payer: BLUE CROSS/BLUE SHIELD | Admitting: Internal Medicine

## 2016-08-06 ENCOUNTER — Encounter: Payer: Self-pay | Admitting: Internal Medicine

## 2016-08-06 VITALS — BP 136/66 | HR 87 | Temp 98.0°F | Ht 72.0 in | Wt 195.5 lb

## 2016-08-06 DIAGNOSIS — E784 Other hyperlipidemia: Secondary | ICD-10-CM

## 2016-08-06 DIAGNOSIS — Z5189 Encounter for other specified aftercare: Secondary | ICD-10-CM | POA: Diagnosis not present

## 2016-08-06 DIAGNOSIS — E109 Type 1 diabetes mellitus without complications: Secondary | ICD-10-CM

## 2016-08-06 DIAGNOSIS — E1169 Type 2 diabetes mellitus with other specified complication: Secondary | ICD-10-CM | POA: Diagnosis not present

## 2016-08-06 DIAGNOSIS — E1165 Type 2 diabetes mellitus with hyperglycemia: Secondary | ICD-10-CM

## 2016-08-06 DIAGNOSIS — E785 Hyperlipidemia, unspecified: Secondary | ICD-10-CM

## 2016-08-06 DIAGNOSIS — Z833 Family history of diabetes mellitus: Secondary | ICD-10-CM | POA: Diagnosis not present

## 2016-08-06 MED ORDER — INSULIN GLARGINE 100 UNIT/ML SOLOSTAR PEN: 23.0000 [IU] | PEN_INJECTOR | Freq: Every day | SUBCUTANEOUS | 1 refills | Status: DC

## 2016-08-06 MED ORDER — INSULIN ASPART 100 UNIT/ML FLEXPEN: PEN_INJECTOR | SUBCUTANEOUS | 11 refills | Status: DC

## 2016-08-06 NOTE — Patient Instructions (Signed)
Mr. Tommi EmeryLockett,  Dineen KidYou're doing great job using her insulin and checking your glucose.  Increase your NovoLog to 10 units 3 times a day before meals. Increase your Lantus to 23 units at night. Continue checking your glucose, you are doing a great job! We have referred you to endocrinology. We would like to see you back in 4 weeks, tried to come back for a morning appointment when you have not eaten anything so we can redraw your lab work.

## 2016-08-06 NOTE — Progress Notes (Signed)
    CC: Hospital follow-up for diabetes  HPI: [name] is a 46 y.o. male with PMHx of ketosis-prone diabetes, anxiety who presents to the clinic for hospital follow-up for diabetes.   Patient was recently admitted in February in DKA with a new diagnosis of diabetes. Patient likely has ketosis-prone diabetes. During hospitalization, patient improved and was weaned off of the insulin drip and transitioned to subcutaneous insulin. C-peptide was low. GAD antibodies were negative. A1c was 13.1. Patient was discharged on NovoLog 7 units 3 times a day with meals and Lantus 20 units at night. He has been compliant with this regimen and has been checking his glucose as recommended. He remains hyperglycemic throughout the day in the 200s to low 300s. Overall, he feels much better. He admits to a family history of type 2 diabetes in his mother. Of note, patient works for a [employer] and will need a letter from an endocrinologist stating that he is okay to continue driving.  Past Medical History:  Diagnosis Date  . Anxiety    takes Hydroxyzine  . Depression    has been on Effexor, not taking at this time  . Diabetes mellitus without complication (HCC)     Review of Systems: Please see pertinent ROS reviewed in HPI and problem based charting.   Physical Exam: Vitals:   08/06/16 1418  BP: 136/66  Pulse: 87  Temp: 98 F (36.7 C)  TempSrc: Oral  SpO2: 100%  Weight: 195 lb 8 oz (88.7 kg)  Height: 6' (1.829 m)   General: Vital signs reviewed.  Patient is well-developed and well-nourished, in no acute distress and cooperative with exam.  Head: Normocephalic and atraumatic. Eyes: PERRLA, conjunctivae normal, no scleral icterus.  Neck: Supple, trachea midline Cardiovascular: RRR, S1 normal, S2 normal, no murmurs, gallops, or rubs. Pulmonary/Chest: Clear to auscultation bilaterally, no wheezes, rales, or rhonchi. Abdominal: Soft, non-tender, non-distended, BS +  Extremities: No  lower extremity edema bilaterally, pulses symmetric and intact bilaterally.  Skin: Warm, dry and intact. No rashes or erythema. Psychiatric: Normal mood and affect. speech and behavior is normal. Cognition and memory are normal.   Assessment & Plan:  See encounters tab for problem based medical decision making. Patient discussed with Dr. Oswaldo DoneVincent

## 2016-08-06 NOTE — Telephone Encounter (Signed)
Lm for rtc 

## 2016-08-09 DIAGNOSIS — E1169 Type 2 diabetes mellitus with other specified complication: Secondary | ICD-10-CM | POA: Insufficient documentation

## 2016-08-09 DIAGNOSIS — E785 Hyperlipidemia, unspecified: Secondary | ICD-10-CM

## 2016-08-09 MED ORDER — ATORVASTATIN CALCIUM 20 MG PO TABS
20.0000 mg | ORAL_TABLET | Freq: Every day | ORAL | 3 refills | Status: DC
Start: 2016-08-09 — End: 2017-11-23

## 2016-08-09 NOTE — Assessment & Plan Note (Addendum)
Patient was recently admitted in February in DKA with a new diagnosis of diabetes. Patient likely has ketosis-prone diabetes. During hospitalization, patient improved and was weaned off of the insulin drip and transitioned to subcutaneous insulin. C-peptide was low. GAD antibodies were negative. A1c was 13.1. Patient was discharged on NovoLog 7 units 3 times a day with meals and Lantus 20 units at night. He has been compliant with this regimen and has been checking his glucose as recommended. He remains hyperglycemic throughout the day in the 200s to low 300s. Overall, he feels much better. He admits to a family history of type 2 diabetes in his mother. Of note, patient works for a [employer] company and will need a letter from an endocrinologist stating that he is okay to continue driving.  Assessment: Ketosis-prone diabetes  Plan: -Increase Lantus to 23 units at night -Increase NovoLog to 10 units 3 times a day with meals -Referral to endocrinology for note for clearance for work -Obtain fasting C-peptide levels at follow-up visit -Patient may be able to wean off of insulin in 10-14 weeks if he has ketosis-prone diabetes and transitioned to oral medications -Follow-up in 4 weeks

## 2016-08-09 NOTE — Assessment & Plan Note (Signed)
Given diagnosis of diabetes, we will start patient on a moderate intensity statin.  Plan: -Start atorvastatin 20 mg daily

## 2016-08-10 ENCOUNTER — Ambulatory Visit: Payer: BLUE CROSS/BLUE SHIELD

## 2016-08-10 NOTE — Telephone Encounter (Signed)
Lm for rtc 

## 2016-08-10 NOTE — Addendum Note (Signed)
Addended by: Erlinda HongVINCENT, DUNCAN T on: 08/10/2016 08:57 AM   Modules accepted: Level of Service

## 2016-08-10 NOTE — Progress Notes (Signed)
Internal Medicine Clinic Attending  I saw and evaluated the patient.  I personally confirmed the key portions of the history and exam documented by Dr. Lawerance BachBurns and I reviewed pertinent patient test results.  The assessment, diagnosis, and plan were formulated together and I agree with the documentation in the resident's note.  Ketosis prone diabetic with recent admission for first episode of DKA and new DM diagnosis. He is doing well on insulin regimen for now. Plan to refer to endocrinology to try for insulin-sparing regimen if they think it is appropriate. Antibodies were negative, C peptide should be repeated in clinic when his glucose levels are closer to normal as a predictor of future ketosis.

## 2016-08-12 ENCOUNTER — Ambulatory Visit (INDEPENDENT_AMBULATORY_CARE_PROVIDER_SITE_OTHER): Payer: BLUE CROSS/BLUE SHIELD | Admitting: Internal Medicine

## 2016-08-12 VITALS — BP 139/78 | HR 62 | Temp 98.1°F | Ht 72.0 in | Wt 199.2 lb

## 2016-08-12 DIAGNOSIS — E119 Type 2 diabetes mellitus without complications: Secondary | ICD-10-CM

## 2016-08-12 DIAGNOSIS — H538 Other visual disturbances: Secondary | ICD-10-CM

## 2016-08-12 DIAGNOSIS — E109 Type 1 diabetes mellitus without complications: Secondary | ICD-10-CM

## 2016-08-12 NOTE — Progress Notes (Signed)
   CC: blurred vision    HPI: [name] is a 46 y.o. with past medical history as outlined below who presents to clinic for with concern for new blurry vision. He is being treated for a new diagnosis of diabetes after he was hospitalized for DKA and found to have A1c 13. He began using insulin earlier this month. He developed blurry vision about 5 days ago he has noticed that it's been more difficult for him to read from both near and far but he feels that his near vision is much worse than his far vision. He has not had any sudden losses in vision and has not had floaters or seen bright flashes. He denies eye pain. He was evaluated for hospital follow up in clinic last week and meter readings showed persistent hyperglycemia throughout the day with CBG 200-300s so his novolog and lantus doses were increased. Since the increase in his CBG have improved, he has still had occasional CBG in the 200s but fasting glucose over the last 3 days has been 130-150.   Please see problem list for status of the pt's chronic medical problems.  Past Medical History:  Diagnosis Date  . Anxiety    takes Hydroxyzine  . Depression    has been on Effexor, not taking at this time  . Diabetes mellitus without complication (HCC)     Review of Systems:  Please see each problem below for a pertinent review of systems.  Physical Exam:  Vitals:   08/12/16 1611  BP: 139/78  Pulse: 62  Temp: 98.1 F (36.7 C)  TempSrc: Oral  SpO2: 99%  Weight: 199 lb 3.2 oz (90.4 kg)  Height: 6' (1.829 m)   Physical Exam  Constitutional: He appears well-developed and well-nourished. No distress.  HENT:  Head: Normocephalic and atraumatic.  Eyes: Conjunctivae are normal. No scleral icterus.  impairment in his vision with reading close objects, he does not have nearsightedness  Neurological: He is alert.  Skin: He is not diaphoretic.  Psychiatric: He has a normal mood and affect. His behavior is normal.     Assessment & Plan:   See Encounters Tab for problem based charting.  Blurred Vision  This blurred vision may be related to osmolar changes in his lense with improvement in his glucose. We expect that this is a transient change and expect that it will resolve over time. We will refer him for opthalmology evaluation.  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 He was provided with a letter and request to postpone the exam.  - referral to opthalmology  - continue current insulin regiment for now  - RTC in 3 weeks.     Patient seen with Dr. Doree BarthelVincentc

## 2016-08-12 NOTE — Patient Instructions (Signed)
It was a pleasure to meet you today [name],   We would like for you to see an eye doctor,  We recommend you see Dr. Dione BoozeGroat, their office phone number is [phone]  Please schedule a follow up appointment in 1 month  Diabetes Mellitus and Food It is important for you to manage your blood sugar (glucose) level. Your blood glucose level can be greatly affected by what you eat. Eating healthier foods in the appropriate amounts throughout the day at about the same time each day will help you control your blood glucose level. It can also help slow or prevent worsening of your diabetes mellitus. Healthy eating may even help you improve the level of your blood pressure and reach or maintain a healthy weight. General recommendations for healthful eating and cooking habits include:  Eating meals and snacks regularly. Avoid going long periods of time without eating to lose weight.  Eating a diet that consists mainly of plant-based foods, such as fruits, vegetables, nuts, legumes, and whole grains.  Using low-heat cooking methods, such as baking, instead of high-heat cooking methods, such as deep frying. Work with your dietitian to make sure you understand how to use the Nutrition Facts information on food labels. How can food affect me? Carbohydrates  Carbohydrates affect your blood glucose level more than any other type of food. Your dietitian will help you determine how many carbohydrates to eat at each meal and teach you how to count carbohydrates. Counting carbohydrates is important to keep your blood glucose at a healthy level, especially if you are using insulin or taking certain medicines for diabetes mellitus. Alcohol  Alcohol can cause sudden decreases in blood glucose (hypoglycemia), especially if you use insulin or take certain medicines for diabetes mellitus. Hypoglycemia can be a life-threatening condition. Symptoms of hypoglycemia (sleepiness, dizziness, and disorientation) are similar  to symptoms of having too much alcohol. If your health care provider has given you approval to drink alcohol, do so in moderation and use the following guidelines:  Women should not have more than one drink per day, and men should not have more than two drinks per day. One drink is equal to:  12 oz of beer.  5 oz of wine.  1 oz of hard liquor.  Do not drink on an empty stomach.  Keep yourself hydrated. Have water, diet soda, or unsweetened iced tea.  Regular soda, juice, and other mixers might contain a lot of carbohydrates and should be counted. What foods are not recommended? As you make food choices, it is important to remember that all foods are not the same. Some foods have fewer nutrients per serving than other foods, even though they might have the same number of calories or carbohydrates. It is difficult to get your body what it needs when you eat foods with fewer nutrients. Examples of foods that you should avoid that are high in calories and carbohydrates but low in nutrients include:  Trans fats (most processed foods list trans fats on the Nutrition Facts label).  Regular soda.  Juice.  Candy.  Sweets, such as cake, pie, doughnuts, and cookies.  Fried foods. What foods can I eat? Eat nutrient-rich foods, which will nourish your body and keep you healthy. The food you should eat also will depend on several factors, including:  The calories you need.  The medicines you take.  Your weight.  Your blood glucose level.  Your blood pressure level.  Your cholesterol level. You should eat a variety  of foods, including:  Protein.  Lean cuts of meat.  Proteins low in saturated fats, such as fish, egg whites, and beans. Avoid processed meats.  Fruits and vegetables.  Fruits and vegetables that may help control blood glucose levels, such as apples, mangoes, and yams.  Dairy products.  Choose fat-free or low-fat dairy products, such as milk, yogurt, and  cheese.  Grains, bread, pasta, and rice.  Choose whole grain products, such as multigrain bread, whole oats, and brown rice. These foods may help control blood pressure.  Fats.  Foods containing healthful fats, such as nuts, avocado, olive oil, canola oil, and fish. Does everyone with diabetes mellitus have the same meal plan? Because every person with diabetes mellitus is different, there is not one meal plan that works for everyone. It is very important that you meet with a dietitian who will help you create a meal plan that is just right for you. This information is not intended to replace advice given to you by your health care provider. Make sure you discuss any questions you have with your health care provider. Document Released: 03/04/2005 Document Revised: 11/13/2015 Document Reviewed: 05/04/2013 Elsevier Interactive Patient Education  2017 [employer].

## 2016-08-12 NOTE — Assessment & Plan Note (Signed)
He is being treated for a new diagnosis of diabetes after he was hospitalized for DKA and found to have A1c 13. He began using insulin earlier this month. He developed blurry vision about 5 days ago he has noticed that it's been more difficult for him to read from both near and far but he feels that his near vision is much worse than his far vision. He has not had any sudden losses in vision and has not had floaters or seen bright flashes. He denies eye pain. He was evaluated for hospital follow up in clinic last week and meter readings showed persistent hyperglycemia throughout the day with CBG 200-300s so his novolog and lantus doses were increased. Since the increase in his CBG have improved, he has still had occasional CBG in the 200s but fasting glucose over the last 3 days has been 130-150.   This blurred vision may be related to osmolar changes in his lense with improvement in his glucose. We expect that this is a transient change and expect that it will resolve over time. We will refer him for opthalmology evaluation.   - referral to opthalmology  - continue current insulin regiment for now  - RTC in 3 weeks.

## 2016-08-13 ENCOUNTER — Telehealth: Payer: Self-pay

## 2016-08-13 NOTE — Telephone Encounter (Signed)
changed

## 2016-08-13 NOTE — Telephone Encounter (Signed)
Want to informed the nurse he is [facility] [address]

## 2016-08-17 NOTE — Progress Notes (Signed)
Internal Medicine Clinic Attending  I saw and evaluated the patient.  I personally confirmed the key portions of the history and exam documented by Dr. Blum and I reviewed pertinent patient test results.  The assessment, diagnosis, and plan were formulated together and I agree with the documentation in the resident's note. 

## 2016-08-19 ENCOUNTER — Other Ambulatory Visit: Payer: Self-pay

## 2016-08-19 NOTE — Telephone Encounter (Signed)
Requesting test strip to be filled @ [facility] [address]

## 2016-08-20 ENCOUNTER — Other Ambulatory Visit: Payer: Self-pay | Admitting: *Deleted

## 2016-08-20 MED ORDER — GLUCOSE BLOOD VI STRP: ORAL_STRIP | 3 refills | Status: DC

## 2016-08-20 NOTE — Telephone Encounter (Signed)
Needs PCP assigned

## 2016-08-23 ENCOUNTER — Telehealth: Payer: Self-pay | Admitting: *Deleted

## 2016-08-23 NOTE — Telephone Encounter (Signed)
Pt checking status.

## 2016-08-23 NOTE — Telephone Encounter (Signed)
Conversation  (Newest Message First)  Rocco PaulsHelen L Atkinson, RN      08/23/16 11:01 AM  Note    Script sent to pharm, 3/2, called pharm, script is available, it was placed on file, pt informed      copied and pasted from 08/23/2016 telephone note.Kingsley SpittleGoldston, Henchy Mccauley Cassady3/5/201811:35 AM

## 2016-08-23 NOTE — Telephone Encounter (Signed)
Script sent to pharm, 3/2, called pharm, script is available, it was placed on file, pt informed

## 2016-08-24 ENCOUNTER — Other Ambulatory Visit: Payer: Self-pay | Admitting: *Deleted

## 2016-08-24 ENCOUNTER — Telehealth: Payer: Self-pay

## 2016-08-24 MED ORDER — GLUCOSE BLOOD VI STRP: ORAL_STRIP | 3 refills | Status: DC

## 2016-08-24 NOTE — Telephone Encounter (Signed)
Pt states test strips is not at the pharmacy. Please call pt back.

## 2016-08-26 LAB — HM DIABETES EYE EXAM

## 2016-09-02 ENCOUNTER — Telehealth: Payer: Self-pay | Admitting: Internal Medicine

## 2016-09-02 NOTE — Telephone Encounter (Signed)
APT. REMINDER CALL, LMTCB °

## 2016-09-03 ENCOUNTER — Telehealth: Payer: Self-pay

## 2016-09-03 ENCOUNTER — Encounter: Payer: Self-pay | Admitting: Internal Medicine

## 2016-09-03 ENCOUNTER — Ambulatory Visit (INDEPENDENT_AMBULATORY_CARE_PROVIDER_SITE_OTHER): Payer: BLUE CROSS/BLUE SHIELD | Admitting: Internal Medicine

## 2016-09-03 ENCOUNTER — Other Ambulatory Visit: Payer: Self-pay | Admitting: Internal Medicine

## 2016-09-03 DIAGNOSIS — E109 Type 1 diabetes mellitus without complications: Secondary | ICD-10-CM | POA: Diagnosis not present

## 2016-09-03 MED ORDER — METFORMIN HCL 500 MG PO TABS: 500.0000 mg | ORAL_TABLET | Freq: Two times a day (BID) | ORAL | 3 refills | Status: DC

## 2016-09-03 NOTE — Telephone Encounter (Signed)
Questions about med. Please call back.  

## 2016-09-03 NOTE — Progress Notes (Signed)
Internal Medicine Clinic Attending  Case discussed with Dr. Truong at the time of the visit.  We reviewed the resident's history and exam and pertinent patient test results.  I agree with the assessment, diagnosis, and plan of care documented in the resident's note.  

## 2016-09-03 NOTE — Patient Instructions (Signed)
Start taking metformin 500mg  once at night for one week then twice a day.

## 2016-09-03 NOTE — Assessment & Plan Note (Addendum)
Assessment: Patient presents for diabetes follow-up. He is on Lantus 20 units daily and NovoLog with meals. He denies any low sugars and states his CBGs ranged from 120s to 130s. He has followed up with ophthalmology last week. He has appointment with endocrinology in April. 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   Plan: Start on metformin 500 mg twice a day. Filled out driver's license request forms. Follow-up in 2 months for diabetes and hemoglobin A1c.

## 2016-09-03 NOTE — Telephone Encounter (Signed)
Wanted to verify his meds, went over meds and times to take, he verb knowledge

## 2016-09-03 NOTE — Progress Notes (Signed)
   CC: DM  HPI:  Joshua [name] is a 46 y.o. with PMHx as outlined below who presents to clinic for diabetes follow up. Please see problem list for further details of patient's chronic medical issues.   Past Medical History:  Diagnosis Date  . Anxiety    takes Hydroxyzine  . Depression    has been on Effexor, not taking at this time  . Diabetes mellitus without complication (HCC)     Review of Systems:  Denies polyuria, polydipsia, blurred vision, nausea, vomiting  Physical Exam:  Vitals:   09/03/16 0920  BP: 137/84  Pulse: 60  Temp: 97.7 F (36.5 C)  TempSrc: Oral  SpO2: 100%  Weight: 202 lb 6.4 oz (91.8 kg)  Height: 6' (1.829 m)   Physical Exam  Constitutional: appears well-developed and well-nourished. No distress.  HENT:  Head: Normocephalic and atraumatic.  Nose: Nose normal.  Cardiovascular: Normal rate, regular rhythm and normal heart sounds.  Exam reveals no gallop and no friction rub.   No murmur heard. Pulmonary/Chest: Effort normal and breath sounds normal. No respiratory distress.  has no wheezes.no rales.  Abdominal: Soft. Bowel sounds are normal.  exhibits no distension. There is no tenderness. There is no rebound and no guarding.  Neurological: alert and oriented to person, place, and time.  Skin: Skin is warm and dry. No rash noted.  not diaphoretic. No erythema. No pallor.   Assessment & Plan:   See Encounters Tab for problem based charting.  Patient discussed with Dr. Oswaldo DoneVincent

## 2016-09-07 ENCOUNTER — Telehealth: Payer: Self-pay | Admitting: *Deleted

## 2016-09-07 NOTE — Telephone Encounter (Signed)
Pt's sister calls and states there is a problem w/ pt getting his insulin, called his normal pharm [facility] they do not have the newest script, gave vo to pharm, informed pt

## 2016-09-28 ENCOUNTER — Other Ambulatory Visit: Payer: Self-pay | Admitting: Internal Medicine

## 2016-10-08 ENCOUNTER — Encounter: Payer: Self-pay | Admitting: *Deleted

## 2016-10-10 ENCOUNTER — Other Ambulatory Visit: Payer: Self-pay | Admitting: Internal Medicine

## 2016-10-12 ENCOUNTER — Telehealth: Payer: Self-pay | Admitting: Internal Medicine

## 2016-10-12 NOTE — Telephone Encounter (Signed)
Patient walked in today to pick up his Short Term Disability forms.  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  Please Advise as patient was seen by you on 09/03/2016 and you completed part of his forms already.  Patient also has been to his Eye Dr. Dione Booze) and to an Endo/(Dr. Sharl Ma) as suggested and this is the last part of the form to be completed for him.

## 2016-10-12 NOTE — Telephone Encounter (Signed)
I am currently on night float. I have already filled out the form that was part of my examination with the patient. Please let me know how I can help. Thanks.   Dr. Danella Penton

## 2016-10-13 NOTE — Telephone Encounter (Signed)
There is still another part of the form that needs to be completed.  Please see my previous message as he has now done his part by seeing a Eye Dr and a Endocrinologist.  They can not fill out the Primary care for his Short Term Disability.  This form is already in your box.  Please advise if you will be able to finish his form for him as he came back in again today as his job has put a deadline on him to get it returned.

## 2016-10-13 NOTE — Telephone Encounter (Signed)
I will take a look at the form. His PCP has an open slot next week 5/2 which may be best for him if the form needs to be completed entirely as I wrote not examined on some portions which you said work would not accept?  Thanks.   Dr. Danella Penton

## 2016-10-14 NOTE — Telephone Encounter (Signed)
The patient came in this morning and picked up his forms and was very thankful for you helping him complete the last part of his requirements.  Patient also stated that he will now take his forms to Dr. Sharl Ma because he is following his diabetes and is aware of the form our office has completed for him.  Patient has also dropped off his Labs from Dr. Daune Perch office for you to review.

## 2016-11-10 ENCOUNTER — Encounter: Payer: BLUE CROSS/BLUE SHIELD | Attending: Internal Medicine | Admitting: *Deleted

## 2016-11-10 DIAGNOSIS — Z713 Dietary counseling and surveillance: Secondary | ICD-10-CM | POA: Insufficient documentation

## 2016-11-10 DIAGNOSIS — Z6830 Body mass index (BMI) 30.0-30.9, adult: Secondary | ICD-10-CM | POA: Insufficient documentation

## 2016-11-10 DIAGNOSIS — E119 Type 2 diabetes mellitus without complications: Secondary | ICD-10-CM | POA: Diagnosis not present

## 2016-11-10 LAB — HEMOGLOBIN A1C: HEMOGLOBIN A1C: 6.6

## 2016-11-10 NOTE — Patient Instructions (Signed)
Plan:  Aim for 4 Carb Choices per meal (60 grams) +/- 1 either way  Aim for 0-2 Carbs per snack if hungry  Include protein in moderation with your meals and snacks Consider reading food labels for Total Carbohydrate of foods Continue with your activity level daily as tolerated Continue checking BG at alternate times per day as directed by MD  Consitinue taking medication as directed by MD

## 2016-11-10 NOTE — Progress Notes (Addendum)
Diabetes Self-Management Education  Visit Type: First/Initial  Appt. Start Time: 0830 Appt. End Time: 1000  11/10/2016  [name], identified by name and date of birth, is a 46 y.o. male with a diagnosis of Diabetes: Type 1. Patient newly diagnosed in February with Type 2 Diabetes. 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 His A1c as of today is 6.6% per Dr. Daune PerchKerr's office. He states he is exercising daily and diet history obtained.  ASSESSMENT  Height 5\' 9"  (1.753 m), weight 207 lb 1.6 oz (93.9 kg). Body mass index is 30.58 kg/m.      Diabetes Self-Management Education - 11/10/16 0843      Visit Information   Visit Type First/Initial     Initial Visit   Diabetes Type Type 1   Are you currently following a meal plan? No   Are you taking your medications as prescribed? Yes   Date Diagnosed Feb 2018     Health Coping   How would you rate your overall health? Good     Psychosocial Assessment   Patient Belief/Attitude about Diabetes Motivated to manage diabetes   Self-care barriers None   Other persons present Patient   Patient Concerns Glycemic Control;Nutrition/Meal planning   Special Needs None   Preferred Learning Style Auditory;Visual;Hands on   How often do you need to have someone help you when you read instructions, pamphlets, or other written materials from your doctor or pharmacy? 1 - Never   What is the last grade level you completed in school? 11th grade     Complications   How often do you check your blood sugar? 3-4 times/day   Fasting Blood glucose range (mg/dL) [id number]   Postprandial Blood glucose range (mg/dL) [phone]   Number of hypoglycemic episodes per month 0   Have you had a dilated eye exam in the past 12 months? Yes   Have you had a dental exam in the past 12 months? No   Are you checking your feet? Yes   How many days per week are you checking your feet? 5      Dietary Intake   Breakfast regular oatmeal x 1 packet OR eggs, toast x 2 with oleo   Snack (morning)  1- 2 whole graham crackers and PNB   Lunch grilled chicken salad or sandwich, OR fish sandwich with cup of pudding OR cup of fruit   Snack (afternoon) not usually   Dinner meat, more vegetables now, starch   Snack (evening) graham crackers again with PNB OR raw vegetables with ranch dressing   Beverage(s) water, sugar free drinks     Exercise   Exercise Type Light (walking / raking leaves)   How many days per week to you exercise? 7   How many minutes per day do you exercise? 30   Total minutes per week of exercise 210     Patient Education   Previous Diabetes Education No   Disease state  Definition of diabetes, type 1 and 2, and the diagnosis of diabetes;Factors that contribute to the development of diabetes   Nutrition management  Role of diet in the treatment of diabetes and the relationship between the three main macronutrients and blood glucose level;Food label reading, portion sizes and measuring food.;Reviewed blood glucose goals for pre and post meals and how to evaluate the patients' food intake on their blood glucose level.   Physical activity  and exercise  Role of exercise on diabetes management, blood pressure control and cardiac health.   Medications Taught/reviewed insulin injection, site rotation, insulin storage and needle disposal.;Reviewed patients medication for diabetes, action, purpose, timing of dose and side effects.   Monitoring Identified appropriate SMBG and/or A1C goals.   Acute complications Taught treatment of hypoglycemia - the 15 rule.   Chronic complications Relationship between chronic complications and blood glucose control   Psychosocial adjustment Role of stress on diabetes     Individualized Goals (developed by patient)   Nutrition Follow meal plan discussed   Physical Activity Exercise 3-5 times per week   Medications take my medication as  prescribed   Monitoring  test blood glucose pre and post meals as discussed     Post-Education Assessment   Patient understands the diabetes disease and treatment process. Demonstrates understanding / competency   Patient understands incorporating nutritional management into lifestyle. Demonstrates understanding / competency   Patient undertands incorporating physical activity into lifestyle. Demonstrates understanding / competency   Patient understands using medications safely. Demonstrates understanding / competency   Patient understands monitoring blood glucose, interpreting and using results Demonstrates understanding / competency   Patient understands prevention, detection, and treatment of acute complications. Demonstrates understanding / competency   Patient understands prevention, detection, and treatment of chronic complications. Demonstrates understanding / competency   Patient understands how to develop strategies to address psychosocial issues. Demonstrates understanding / competency   Patient understands how to develop strategies to promote health/change behavior. Demonstrates understanding / competency     Outcomes   Expected Outcomes Demonstrated interest in learning. Expect positive outcomes   Future DMSE 4-6 wks   Program Status Completed      Individualized Plan for Diabetes Self-Management Training:   Learning Objective:  Patient will have a greater understanding of diabetes self-management. Patient education plan is to attend individual and/or group sessions per assessed needs and concerns.   Plan:   Patient Instructions  Plan:  Aim for 4 Carb Choices per meal (60 grams) +/- 1 either way  Aim for 0-2 Carbs per snack if hungry  Include protein in moderation with your meals and snacks Consider reading food labels for Total Carbohydrate of foods Continue with your activity level daily as tolerated Continue checking BG at alternate times per day as directed by MD   Consitinue taking medication as directed by MD  Plan to discuss Libre CGM at next visit as alternative to finger sticks.   Expected Outcomes:  Demonstrated interest in learning. Expect positive outcomes  Education material provided: Living Well with Diabetes, A1C conversion sheet, Meal plan card and Carbohydrate counting sheet, Insulin Action handout, Insulin site rotation explained  If problems or questions, patient to contact team via:  Phone  Future DSME appointment: 4-6 wks

## 2016-11-25 ENCOUNTER — Other Ambulatory Visit: Payer: Self-pay | Admitting: Internal Medicine

## 2016-11-25 DIAGNOSIS — E109 Type 1 diabetes mellitus without complications: Secondary | ICD-10-CM

## 2016-12-15 ENCOUNTER — Ambulatory Visit: Payer: BLUE CROSS/BLUE SHIELD | Admitting: *Deleted

## 2016-12-24 ENCOUNTER — Other Ambulatory Visit: Payer: Self-pay | Admitting: Internal Medicine

## 2016-12-24 DIAGNOSIS — E109 Type 1 diabetes mellitus without complications: Secondary | ICD-10-CM

## 2017-01-20 ENCOUNTER — Other Ambulatory Visit: Payer: Self-pay | Admitting: Internal Medicine

## 2017-01-20 DIAGNOSIS — E109 Type 1 diabetes mellitus without complications: Secondary | ICD-10-CM

## 2017-02-22 ENCOUNTER — Other Ambulatory Visit: Payer: Self-pay | Admitting: Internal Medicine

## 2017-02-22 DIAGNOSIS — E109 Type 1 diabetes mellitus without complications: Secondary | ICD-10-CM

## 2017-02-23 ENCOUNTER — Encounter: Payer: Self-pay | Admitting: Internal Medicine

## 2017-02-23 ENCOUNTER — Encounter: Payer: BLUE CROSS/BLUE SHIELD | Admitting: Internal Medicine

## 2017-02-25 NOTE — Telephone Encounter (Signed)
Patient missed his appointment with me on 02/23/17. I have not seen patient yet. Per chart review, he has established with Endocrinology, Dr. Sharl MaKerr, for his diabetes management. I am not sure what he is actually taking at home or the status of his diabetic control. I will defer refill and dosing of his insulin to his Endocrinologist at this time or he can be seen in clinic in the next week for evaluation.

## 2017-02-28 NOTE — Telephone Encounter (Signed)
Called pt - stated he will have [facility] send rx refill request to Dr Daune PerchKerr's office. Also stated he will re-schedule appt w/Dr Allena KatzPatel - call transferred to front office.

## 2017-03-02 ENCOUNTER — Encounter: Payer: Self-pay | Admitting: Dietician

## 2017-03-02 ENCOUNTER — Ambulatory Visit (INDEPENDENT_AMBULATORY_CARE_PROVIDER_SITE_OTHER): Payer: BLUE CROSS/BLUE SHIELD | Admitting: Internal Medicine

## 2017-03-02 VITALS — BP 148/83 | HR 70 | Temp 98.5°F | Ht 72.0 in | Wt 199.1 lb

## 2017-03-02 DIAGNOSIS — Z0000 Encounter for general adult medical examination without abnormal findings: Secondary | ICD-10-CM

## 2017-03-02 DIAGNOSIS — Z794 Long term (current) use of insulin: Secondary | ICD-10-CM

## 2017-03-02 DIAGNOSIS — E119 Type 2 diabetes mellitus without complications: Secondary | ICD-10-CM

## 2017-03-02 LAB — GLUCOSE, CAPILLARY: Glucose-Capillary: 87 mg/dL (ref 65–99)

## 2017-03-02 LAB — POCT GLYCOSYLATED HEMOGLOBIN (HGB A1C): HEMOGLOBIN A1C: 5.7

## 2017-03-02 MED ORDER — GLUCOSE BLOOD VI STRP: ORAL_STRIP | 12 refills | Status: DC

## 2017-03-02 MED ORDER — INSULIN GLARGINE 100 UNIT/ML SOLOSTAR PEN: 20.0000 [IU] | PEN_INJECTOR | Freq: Every day | SUBCUTANEOUS | 0 refills | Status: DC

## 2017-03-02 MED ORDER — METFORMIN HCL 500 MG PO TABS: 500.0000 mg | ORAL_TABLET | Freq: Two times a day (BID) | ORAL | 2 refills | Status: DC

## 2017-03-02 NOTE — Patient Instructions (Addendum)
It was a pleasure to meet you [name].  Your diabetes is well-controlled. Your Hemoglobin A1c is 5.7 today. Keep up the good work.  We will decrease your Lantus to 20 units at night to avoid the low blood sugars you are feeling. Continue Novolog 10 units three times a day with meals.  Please let us know if you are able to get the flu, pneumonia, and tetanus vaccines through work.  Please follow up with me in 3 months or sooner if needed.

## 2017-03-02 NOTE — Progress Notes (Signed)
   CC: Diabetes  HPI:  [name] is a 46 y.o. male with PMH of Insulin Dependent Type 2 Diabetes with history of DKA who presents for follow up management of his DM and healthcare maintenance. This is his first visit with me.  T2DM: Patient has established with Endocrinology, Dr. Sharl [name]. His last A1c was 6.6 on 11/10/16 on last visit with Dr. Sharl [name]. Lab work that visit included a normal C-peptide level, normal GAD-65, and negative anti-pancreatic islet cell test. He has been taking Metformin 500 mg BID, Novolog 10 units TIDAC, and Lantus 23 units qhs. He brought his meter which shows average readings of 109 with a single high of 211 and single low of 71. He did feel dizzy when his CBG was 71 which corrected with some orange juice. He otherwise denies other hypoglycemic or hyperglycemic episodes.  Healthcare Maintenance: Patient reports he will receive the flu, pneumonia, and Tdap vaccinations through work. He has not had HIV screening and is agreeable for testing this visit.   Past Medical History:  Diagnosis Date  . Anxiety    takes Hydroxyzine  . Depression    has been on Effexor, not taking at this time  . Diabetes mellitus without complication (HCC) 07/2016   Review of Systems:   Review of Systems  Constitutional: Negative for chills and fever.  Respiratory: Negative for shortness of breath.   Cardiovascular: Negative for chest pain and leg swelling.  Gastrointestinal: Negative for nausea and vomiting.  Musculoskeletal: Negative for falls.  Neurological: Negative for tingling, sensory change and loss of consciousness.     Physical Exam:  Vitals:   03/02/17 1317  BP: (!) 148/83  Pulse: 70  Temp: 98.5 F (36.9 C)  TempSrc: Oral  SpO2: 99%  Weight: 199 lb 1.6 oz (90.3 kg)  Height: 6' (1.829 m)   Physical Exam  Constitutional: He is oriented to person, place, and time. He appears well-developed and well-nourished. No distress.  HENT:  Head: Normocephalic and  atraumatic.  Cardiovascular: Normal rate and regular rhythm.   Pulmonary/Chest: Effort normal. No respiratory distress. He has no wheezes. He has no rales.  Musculoskeletal: He exhibits no edema.  Neurological: He is alert and oriented to person, place, and time.  Skin: Skin is warm. He is not diaphoretic.  Psychiatric: He has a normal mood and affect.    Assessment & Plan:   See Encounters Tab for problem based charting.  Patient discussed with Dr. Josem KaufmannKlima  Insulin dependent type 2 diabetes mellitus (HCC) Repeat A1c is 5.7 this visit. His diabetes is currently very well-controlled and also concerning that he had a hypoglycemic episode although this was limited to a single episode. We will titrate his Metformin up as tolerated to 1000 mg BID and decrease his Lantus to 20 units nightly. We will likely be able to continue to decrease his insulin requirements on follow up. - Titrate Metformin up to 1000 mg am 500 mg pm for 1 week then 1000 mg BID as tolerated - Decrease Lantus to 20 units qhs - Continue Novolog 10 units TIDAC - Repeat A1c in 3 months - Refilled Accu-Chek Aviva Plus test strips  Healthcare maintenance Patient plans to receive influenza, pneumovax, and Tdap vaccinations through work. I have asked him to send records to us when completed to update our chart. His HIV antibody is non-reactive.

## 2017-03-03 LAB — HIV ANTIBODY (ROUTINE TESTING W REFLEX): HIV Screen 4th Generation wRfx: NONREACTIVE

## 2017-03-03 NOTE — Progress Notes (Signed)
Case discussed with Dr. Patel at the time of the visit.  We reviewed the resident's history and exam and pertinent patient test results.  I agree with the assessment, diagnosis, and plan of care documented in the resident's note. 

## 2017-03-03 NOTE — Assessment & Plan Note (Signed)
Patient plans to receive influenza, pneumovax, and Tdap vaccinations through work. I have asked him to send records to us when completed to update our chart. His HIV antibody is non-reactive.

## 2017-03-03 NOTE — Assessment & Plan Note (Signed)
Repeat A1c is 5.7 this visit. His diabetes is currently very well-controlled and also concerning that he had a hypoglycemic episode although this was limited to a single episode. We will titrate his Metformin up as tolerated to 1000 mg BID and decrease his Lantus to 20 units nightly. We will likely be able to continue to decrease his insulin requirements on follow up. - Titrate Metformin up to 1000 mg am 500 mg pm for 1 week then 1000 mg BID as tolerated - Decrease Lantus to 20 units qhs - Continue Novolog 10 units TIDAC - Repeat A1c in 3 months - Refilled Accu-Chek Aviva Plus test strips

## 2017-05-09 ENCOUNTER — Other Ambulatory Visit: Payer: Self-pay | Admitting: *Deleted

## 2017-05-09 DIAGNOSIS — E119 Type 2 diabetes mellitus without complications: Secondary | ICD-10-CM

## 2017-05-09 DIAGNOSIS — Z794 Long term (current) use of insulin: Principal | ICD-10-CM

## 2017-05-09 MED ORDER — INSULIN GLARGINE 100 UNIT/ML SOLOSTAR PEN: 20.0000 [IU] | PEN_INJECTOR | Freq: Every day | SUBCUTANEOUS | 0 refills | Status: DC

## 2017-05-16 NOTE — Telephone Encounter (Signed)
Received "second" refill request from pt's pharmacy for his Lantus.  Rx was previously approved on 05/09/2017 but refill status set to "no print" and pharmacy did not receive rx .  Rx phoned in-will also send appt request to front office .Joshua [name], [name] Cassady11/26/201810:56 AM

## 2017-07-14 ENCOUNTER — Other Ambulatory Visit: Payer: Self-pay | Admitting: Internal Medicine

## 2017-07-14 DIAGNOSIS — E119 Type 2 diabetes mellitus without complications: Secondary | ICD-10-CM

## 2017-07-14 DIAGNOSIS — Z794 Long term (current) use of insulin: Principal | ICD-10-CM

## 2017-07-15 ENCOUNTER — Other Ambulatory Visit: Payer: Self-pay | Admitting: *Deleted

## 2017-07-15 DIAGNOSIS — Z794 Long term (current) use of insulin: Principal | ICD-10-CM

## 2017-07-15 DIAGNOSIS — E119 Type 2 diabetes mellitus without complications: Secondary | ICD-10-CM

## 2017-07-15 NOTE — Telephone Encounter (Signed)
Needs f/u appt per Dr Allena KatzPatel Thanks

## 2017-07-16 MED ORDER — GLUCOSE BLOOD VI STRP: ORAL_STRIP | 2 refills | Status: DC

## 2017-08-17 ENCOUNTER — Ambulatory Visit: Payer: BLUE CROSS/BLUE SHIELD | Admitting: Internal Medicine

## 2017-08-17 ENCOUNTER — Encounter (INDEPENDENT_AMBULATORY_CARE_PROVIDER_SITE_OTHER): Payer: Self-pay

## 2017-08-17 VITALS — BP 148/82 | HR 87 | Temp 99.1°F | Wt 191.7 lb

## 2017-08-17 DIAGNOSIS — R030 Elevated blood-pressure reading, without diagnosis of hypertension: Secondary | ICD-10-CM | POA: Diagnosis not present

## 2017-08-17 DIAGNOSIS — Z8639 Personal history of other endocrine, nutritional and metabolic disease: Secondary | ICD-10-CM | POA: Diagnosis not present

## 2017-08-17 DIAGNOSIS — E785 Hyperlipidemia, unspecified: Secondary | ICD-10-CM | POA: Diagnosis not present

## 2017-08-17 DIAGNOSIS — E119 Type 2 diabetes mellitus without complications: Secondary | ICD-10-CM

## 2017-08-17 DIAGNOSIS — E1169 Type 2 diabetes mellitus with other specified complication: Secondary | ICD-10-CM | POA: Diagnosis not present

## 2017-08-17 DIAGNOSIS — Z79899 Other long term (current) drug therapy: Secondary | ICD-10-CM

## 2017-08-17 DIAGNOSIS — I10 Essential (primary) hypertension: Secondary | ICD-10-CM | POA: Insufficient documentation

## 2017-08-17 DIAGNOSIS — Z23 Encounter for immunization: Secondary | ICD-10-CM

## 2017-08-17 DIAGNOSIS — Z794 Long term (current) use of insulin: Secondary | ICD-10-CM

## 2017-08-17 LAB — POCT GLYCOSYLATED HEMOGLOBIN (HGB A1C): HEMOGLOBIN A1C: 5.6

## 2017-08-17 LAB — GLUCOSE, CAPILLARY: GLUCOSE-CAPILLARY: 107 mg/dL — AB (ref 65–99)

## 2017-08-17 MED ORDER — INSULIN ASPART 100 UNIT/ML FLEXPEN: PEN_INJECTOR | SUBCUTANEOUS | 11 refills | Status: DC

## 2017-08-17 MED ORDER — GLUCOSE BLOOD VI STRP: ORAL_STRIP | 2 refills | Status: DC

## 2017-08-17 MED ORDER — METFORMIN HCL 1000 MG PO TABS: 1000.0000 mg | ORAL_TABLET | Freq: Two times a day (BID) | ORAL | 3 refills | Status: DC

## 2017-08-17 NOTE — Progress Notes (Signed)
CC: Diabetes  HPI:  Joshua [name] is a 47 y.o. male with past medical history of insulin-dependent type 2 diabetes with history of DKA who presents for follow-up management of his diabetes.  Please see problem based charting for status of patient's chronic medical issues.  Insulin dependent type 2 diabetes mellitus (HCC) Last A1c was 5.7 on 03/02/2017.  Prior workup of his diabetes includes a negative anti-pancreatic islet cell test, normal C-peptide, and normal GAD-65.  Since last visit he has increased his metformin to 2000 mg twice daily which she has tolerated well.  Continues to take Lantus 20 units every night.  Is currently prescribed NovoLog 10 units 3 times daily before meals, however is only taking this 1-2 times per day depending on his pre-meal CBGs.  He checks his blood sugars 3 times a day before meals and will take his NovoLog if his CBGs are between 120-130.  His average readings at home are 112.  He denies any obvious symptoms of hypoglycemia or hyperglycemia. A/P: Repeat A1c this visit is 5.6.  His diabetes remains well controlled.  He denies any obvious hypoglycemic symptoms however does have a couple low values based on his meter read out.  We will reduce his mealtime NovoLog and continue his Lantus and metformin as prescribed with hopes of maybe coming off of mealtime insulin in the future.  He may benefit from switching his mealtime NovoLog for non-insulin agents such as Victoza in the future. -Continue metformin 1000 mg twice daily -Continue Lantus 20 units nightly -Decrease NovoLog to 5 units 3 times daily before meals -Repeat A1c in 3 months -Refilled Accu-Chek Aviva plus test strips  Hyperlipidemia associated with type 2 diabetes mellitus (HCC) He reports adherence to atorvastatin 20 mg daily without side effect. A/P: Continue moderate intensity atorvastatin 20 mg daily.  Elevated blood pressure reading Blood pressure on arrival this visit is 148/82.  He is  not currently on antihypertensive medications.  He has had borderline elevated blood pressure readings in the past per chart review. A/P: Blood pressure is above goal 130/80 this visit and on several prior visits.  We discussed possibly starting antihypertensive medications this visit however he prefers to first start with lifestyle modifications including dietary and exercise changes.  If blood pressure remains elevated on follow-up will consider adding on an ACE inhibitor.  Previous urine microalbumin ratio was not consistent with proteinuria. - DASH diet, exercise - Consider adding antihypertensive on follow-up if BP remains above goal  Need for 23-polyvalent pneumococcal polysaccharide vaccine PPSV23 given this visit.    Past Medical History:  Diagnosis Date  . Anxiety    takes Hydroxyzine  . Depression    has been on Effexor, not taking at this time  . Diabetes mellitus without complication (HCC) 07/2016   Review of Systems:   Review of Systems  Respiratory: Negative for shortness of breath.   Cardiovascular: Negative for chest pain, palpitations and leg swelling.  Gastrointestinal: Negative for nausea and vomiting.  Musculoskeletal: Negative for falls.  Neurological: Negative for dizziness, loss of consciousness and headaches.     Physical Exam:  Vitals:   08/17/17 1607  BP: (!) 148/82  Pulse: 87  Temp: 99.1 F (37.3 C)  TempSrc: Oral  SpO2: 100%  Weight: 191 lb 11.2 oz (87 kg)   Physical Exam  Constitutional: He is oriented to person, place, and time. He appears well-developed and well-nourished. No distress.  Cardiovascular: Normal rate and regular rhythm.  No murmur heard. Pulmonary/Chest:  Effort normal. No respiratory distress. He has no wheezes.  Musculoskeletal: He exhibits no edema.  Neurological: He is alert and oriented to person, place, and time.  Skin: Skin is warm. He is not diaphoretic.    Assessment & Plan:   See Encounters Tab for problem  based charting.  Patient discussed with Dr. Heide SparkNarendra

## 2017-08-17 NOTE — Patient Instructions (Addendum)
It was a pleasure to see you again Joshua [name].  Your diabetes is well-controlled.  Please continue your Lantus 20 units every night.  We will decrease your Novolog to 5 units three times per day with meals.  I have refilled your Metformin 1000 mg twice a day.  Please continue to monitor your blood sugars.  We are giving you the pneumonia vaccine today.  Please follow up with [place] in 3 months or sooner if needed.   DASH Eating Plan DASH stands for "Dietary Approaches to Stop Hypertension." The DASH eating plan is a healthy eating plan that has been shown to reduce high blood pressure (hypertension). It may also reduce your risk for type 2 diabetes, heart disease, and stroke. The DASH eating plan may also help with weight loss. What are tips for following this plan? General guidelines  Avoid eating more than 2,300 mg (milligrams) of salt (sodium) a day. If you have hypertension, you may need to reduce your sodium intake to 1,500 mg a day.  Limit alcohol intake to no more than 1 drink a day for nonpregnant women and 2 drinks a day for men. One drink equals 12 oz of beer, 5 oz of wine, or 1 oz of hard liquor.  Work with your health care provider to maintain a healthy body weight or to lose weight. Ask what an ideal weight is for you.  Get at least 30 minutes of exercise that causes your heart to beat faster (aerobic exercise) most days of the week. Activities may include walking, swimming, or biking.  Work with your health care provider or diet and nutrition specialist (dietitian) to adjust your eating plan to your individual calorie needs. Reading food labels  Check food labels for the amount of sodium per serving. Choose foods with less than 5 percent of the Daily Value of sodium. Generally, foods with less than 300 mg of sodium per serving fit into this eating plan.  To find whole grains, look for the word "whole" as the first word in the ingredient list. Shopping  Buy products  labeled as "low-sodium" or "no salt added."  Buy fresh foods. Avoid canned foods and premade or frozen meals. Cooking  Avoid adding salt when cooking. Use salt-free seasonings or herbs instead of table salt or sea salt. Check with your health care provider or pharmacist before using salt substitutes.  Do not fry foods. Cook foods using healthy methods such as baking, boiling, grilling, and broiling instead.  Cook with heart-healthy oils, such as olive, canola, soybean, or sunflower oil. Meal planning   Eat a balanced diet that includes: ? 5 or more servings of fruits and vegetables each day. At each meal, try to fill half of your plate with fruits and vegetables. ? Up to 6-8 servings of whole grains each day. ? Less than 6 oz of lean meat, poultry, or fish each day. A 3-oz serving of meat is about the same size as a deck of cards. One egg equals 1 oz. ? 2 servings of low-fat dairy each day. ? A serving of nuts, seeds, or beans 5 times each week. ? Heart-healthy fats. Healthy fats called Omega-3 fatty acids are found in foods such as flaxseeds and coldwater fish, like sardines, salmon, and mackerel.  Limit how much you eat of the following: ? Canned or prepackaged foods. ? Food that is high in trans fat, such as fried foods. ? Food that is high in saturated fat, such as fatty meat. ? Sweets,  desserts, sugary drinks, and other foods with added sugar. ? Full-fat dairy products.  Do not salt foods before eating.  Try to eat at least 2 vegetarian meals each week.  Eat more home-cooked food and less restaurant, buffet, and fast food.  When eating at a restaurant, ask that your food be prepared with less salt or no salt, if possible. What foods are recommended? The items listed may not be a complete list. Talk with your dietitian about what dietary choices are best for you. Grains Whole-grain or whole-wheat bread. Whole-grain or whole-wheat pasta. Brown rice. [place] Morrow. Bulgur.  Whole-grain and low-sodium cereals. Pita bread. Low-fat, low-sodium crackers. Whole-wheat flour tortillas. Vegetables Fresh or frozen vegetables (raw, steamed, roasted, or grilled). Low-sodium or reduced-sodium tomato and vegetable juice. Low-sodium or reduced-sodium tomato sauce and tomato paste. Low-sodium or reduced-sodium canned vegetables. Fruits All fresh, dried, or frozen fruit. Canned fruit in natural juice (without added sugar). Meat and other protein foods Skinless chicken or [place]. Ground chicken or [place]. Pork with fat trimmed off. Fish and seafood. Egg whites. Dried beans, peas, or lentils. Unsalted nuts, nut butters, and seeds. Unsalted canned beans. Lean cuts of beef with fat trimmed off. Low-sodium, lean deli meat. Dairy Low-fat (1%) or fat-free (skim) milk. Fat-free, low-fat, or reduced-fat cheeses. Nonfat, low-sodium ricotta or cottage cheese. Low-fat or nonfat yogurt. Low-fat, low-sodium cheese. Fats and oils Soft margarine without trans fats. Vegetable oil. Low-fat, reduced-fat, or light mayonnaise and salad dressings (reduced-sodium). Canola, safflower, olive, soybean, and sunflower oils. Avocado. Seasoning and other foods Herbs. Spices. Seasoning mixes without salt. Unsalted popcorn and pretzels. Fat-free sweets. What foods are not recommended? The items listed may not be a complete list. Talk with your dietitian about what dietary choices are best for you. Grains Baked goods made with fat, such as croissants, muffins, or some breads. Dry pasta or rice meal packs. Vegetables Creamed or fried vegetables. Vegetables in a cheese sauce. Regular canned vegetables (not low-sodium or reduced-sodium). Regular canned tomato sauce and paste (not low-sodium or reduced-sodium). Regular tomato and vegetable juice (not low-sodium or reduced-sodium). Angie Fava. Olives. Fruits Canned fruit in a light or heavy syrup. Fried fruit. Fruit in cream or butter sauce. Meat and other protein  foods Fatty cuts of meat. Ribs. Fried meat. Berniece Salines. Sausage. Bologna and other processed lunch meats. Salami. Fatback. Hotdogs. Bratwurst. Salted nuts and seeds. Canned beans with added salt. Canned or smoked fish. Whole eggs or egg yolks. Chicken or [place] with skin. Dairy Whole or 2% milk, cream, and half-and-half. Whole or full-fat cream cheese. Whole-fat or sweetened yogurt. Full-fat cheese. Nondairy creamers. Whipped toppings. Processed cheese and cheese spreads. Fats and oils Butter. Stick margarine. Lard. Shortening. Ghee. Bacon fat. Tropical oils, such as coconut, palm kernel, or palm oil. Seasoning and other foods Salted popcorn and pretzels. Onion salt, garlic salt, seasoned salt, table salt, and sea salt. Worcestershire sauce. Tartar sauce. Barbecue sauce. Teriyaki sauce. Soy sauce, including reduced-sodium. Steak sauce. Canned and packaged gravies. Fish sauce. Oyster sauce. Cocktail sauce. Horseradish that you find on the shelf. Ketchup. Mustard. Meat flavorings and tenderizers. Bouillon cubes. Hot sauce and Tabasco sauce. Premade or packaged marinades. Premade or packaged taco seasonings. Relishes. Regular salad dressings. Where to find more information:  National Heart, Lung, and [place]: https://wilson-eaton.com/  American Heart Association: www.heart.org Summary  The DASH eating plan is a healthy eating plan that has been shown to reduce high blood pressure (hypertension). It may also reduce your risk for type 2 diabetes, heart  disease, and stroke.  With the DASH eating plan, you should limit salt (sodium) intake to 2,300 mg a day. If you have hypertension, you may need to reduce your sodium intake to 1,500 mg a day.  When on the DASH eating plan, aim to eat more fresh fruits and vegetables, whole grains, lean proteins, low-fat dairy, and heart-healthy fats.  Work with your health care provider or diet and nutrition specialist (dietitian) to adjust your eating plan to your  individual calorie needs. This information is not intended to replace advice given to you by your health care provider. Make sure you discuss any questions you have with your health care provider. Document Released: 05/27/2011 Document Revised: 05/31/2016 Document Reviewed: 05/31/2016 Elsevier Interactive Patient Education  [employer]2018 Elsevier Inc.

## 2017-08-19 DIAGNOSIS — Z23 Encounter for immunization: Secondary | ICD-10-CM | POA: Insufficient documentation

## 2017-08-19 NOTE — Assessment & Plan Note (Signed)
PPSV23 given this visit.

## 2017-08-19 NOTE — Assessment & Plan Note (Signed)
He reports adherence to atorvastatin 20 mg daily without side effect. A/P: Continue moderate intensity atorvastatin 20 mg daily.

## 2017-08-19 NOTE — Assessment & Plan Note (Signed)
Last A1c was 5.7 on 03/02/2017.  Prior workup of his diabetes includes a negative anti-pancreatic islet cell test, normal C-peptide, and normal GAD-65.  Since last visit he has increased his metformin to 2000 mg twice daily which she has tolerated well.  Continues to take Lantus 20 units every night.  Is currently prescribed NovoLog 10 units 3 times daily before meals, however is only taking this 1-2 times per day depending on his pre-meal CBGs.  He checks his blood sugars 3 times a day before meals and will take his NovoLog if his CBGs are between 120-130.  His average readings at home are 112.  He denies any obvious symptoms of hypoglycemia or hyperglycemia. A/P: Repeat A1c this visit is 5.6.  His diabetes remains well controlled.  He denies any obvious hypoglycemic symptoms however does have a couple low values based on his meter read out.  We will reduce his mealtime NovoLog and continue his Lantus and metformin as prescribed with hopes of maybe coming off of mealtime insulin in the future.  He may benefit from switching his mealtime NovoLog for non-insulin agents such as Victoza in the future. -Continue metformin 1000 mg twice daily -Continue Lantus 20 units nightly -Decrease NovoLog to 5 units 3 times daily before meals -Repeat A1c in 3 months -Refilled Accu-Chek Aviva plus test strips

## 2017-08-19 NOTE — Assessment & Plan Note (Signed)
Blood pressure on arrival this visit is 148/82.  He is not currently on antihypertensive medications.  He has had borderline elevated blood pressure readings in the past per chart review. A/P: Blood pressure is above goal 130/80 this visit and on several prior visits.  We discussed possibly starting antihypertensive medications this visit however he prefers to first start with lifestyle modifications including dietary and exercise changes.  If blood pressure remains elevated on follow-up will consider adding on an ACE inhibitor.  Previous urine microalbumin ratio was not consistent with proteinuria. - DASH diet, exercise - Consider adding antihypertensive on follow-up if BP remains above goal

## 2017-08-22 NOTE — Progress Notes (Signed)
Internal Medicine Clinic Attending  Case discussed with Dr. Patel at the time of the visit.  We reviewed the resident's history and exam and pertinent patient test results.  I agree with the assessment, diagnosis, and plan of care documented in the resident's note.  

## 2017-08-30 ENCOUNTER — Other Ambulatory Visit: Payer: Self-pay | Admitting: Internal Medicine

## 2017-08-30 DIAGNOSIS — E119 Type 2 diabetes mellitus without complications: Secondary | ICD-10-CM

## 2017-08-30 DIAGNOSIS — Z794 Long term (current) use of insulin: Principal | ICD-10-CM

## 2017-10-17 ENCOUNTER — Other Ambulatory Visit: Payer: Self-pay | Admitting: Internal Medicine

## 2017-11-13 ENCOUNTER — Other Ambulatory Visit: Payer: Self-pay | Admitting: Internal Medicine

## 2017-11-15 NOTE — Telephone Encounter (Signed)
Next appt scheduled  6/5 with PCP. 

## 2017-11-23 ENCOUNTER — Ambulatory Visit: Payer: BLUE CROSS/BLUE SHIELD | Admitting: Internal Medicine

## 2017-11-23 ENCOUNTER — Encounter: Payer: Self-pay | Admitting: Internal Medicine

## 2017-11-23 ENCOUNTER — Other Ambulatory Visit: Payer: Self-pay

## 2017-11-23 VITALS — BP 134/81 | HR 72 | Temp 98.4°F | Ht 69.0 in | Wt 190.3 lb

## 2017-11-23 DIAGNOSIS — Z794 Long term (current) use of insulin: Secondary | ICD-10-CM | POA: Diagnosis not present

## 2017-11-23 DIAGNOSIS — E1169 Type 2 diabetes mellitus with other specified complication: Secondary | ICD-10-CM | POA: Diagnosis not present

## 2017-11-23 DIAGNOSIS — E785 Hyperlipidemia, unspecified: Secondary | ICD-10-CM | POA: Diagnosis not present

## 2017-11-23 DIAGNOSIS — Z79899 Other long term (current) drug therapy: Secondary | ICD-10-CM

## 2017-11-23 DIAGNOSIS — E119 Type 2 diabetes mellitus without complications: Secondary | ICD-10-CM | POA: Diagnosis not present

## 2017-11-23 LAB — POCT GLYCOSYLATED HEMOGLOBIN (HGB A1C): Hemoglobin A1C: 5.5 % (ref 4.0–5.6)

## 2017-11-23 LAB — GLUCOSE, CAPILLARY: Glucose-Capillary: 148 mg/dL — ABNORMAL HIGH (ref 65–99)

## 2017-11-23 MED ORDER — ATORVASTATIN CALCIUM 20 MG PO TABS: 20.0000 mg | ORAL_TABLET | Freq: Every day | ORAL | 3 refills | Status: DC

## 2017-11-23 NOTE — Assessment & Plan Note (Signed)
His last A1c was 5.6 on 08/17/2017.  He is currently taking metformin 1000 mg twice daily, Lantus 20 units nightly, and is now only using his mealtime NovoLog 5 units once or twice a week these have not been high enough to warrant his mealtime insulin.  He brought his meter and his average blood sugars are 106 with a single high of 217 and a single low of 58 caused him to feel sweaty.  He treated his hypoglycemia with a glucose tablet.  He denies any numbness, tingling, dyspnea, lightheadedness, syncope.  He reports some blurry vision with a near sight. A/P: Repeat A1c today is 5.5.  His diabetes remains very well controlled.  At this time we will discontinue his mealtime NovoLog continue his basal insulin and metformin for now.  In the future we can consider treating his basal down or switching to Victoza if needed.  We will try to avoid SGLT2 inhibitors given his history of DKA. -Continue metformin 1000 mg twice daily -Continue Lantus 20 units nightly -Stop mealtime NovoLog -Follow-up in 3 months for repeat A1c medication adjustments as needed -He is due for his annual eye exam and advised to schedule this with Dr. Dione BoozeGroat

## 2017-11-23 NOTE — Patient Instructions (Signed)
It was a pleasure to see you again [name].  You are doing great with your diabetes.  Your hemoglobin A1c is 5.5 today.  Please stop your Novolog. Continue your Lantus 20 units every night and your Metformin.  Please call Dr. Laruth BouchardGroat's office for another eye appointment.  Follow up with us again in 3 months or sooner if needed.

## 2017-11-23 NOTE — Progress Notes (Signed)
   CC: Diabetes  HPI:  [name] is a 47 y.o. past medical history of insulin-dependent type 2 diabetes with history of DKA who presents for follow-up management of his diabetes.  Please see problem based charting for status of patient's chronic medical issues.  Insulin dependent type 2 diabetes mellitus (HCC) His last A1c was 5.6 on 08/17/2017.  He is currently taking metformin 1000 mg twice daily, Lantus 20 units nightly, and is now only using his mealtime NovoLog 5 units once or twice a week these have not been high enough to warrant his mealtime insulin.  He brought his meter and his average blood sugars are 106 with a single high of 217 and a single low of 58 caused him to feel sweaty.  He treated his hypoglycemia with a glucose tablet.  He denies any numbness, tingling, dyspnea, lightheadedness, syncope.  He reports some blurry vision with a near sight. A/P: Repeat A1c today is 5.5.  His diabetes remains very well controlled.  At this time we will discontinue his mealtime NovoLog continue his basal insulin and metformin for now.  In the future we can consider treating his basal down or switching to Victoza if needed.  We will try to avoid SGLT2 inhibitors given his history of DKA. -Continue metformin 1000 mg twice daily -Continue Lantus 20 units nightly -Stop mealtime NovoLog -Follow-up in 3 months for repeat A1c medication adjustments as needed -He is due for his annual eye exam and advised to schedule this with Dr. Dione BoozeGroat  Hyperlipidemia associated with type 2 diabetes mellitus (HCC) He reports adherence and tolerance to atorvastatin 20 mg daily. A/P: Continue moderate intensity atorvastatin 20 mg daily in the setting of diabetes.    Past Medical History:  Diagnosis Date  . Anxiety    takes Hydroxyzine  . Depression    has been on Effexor, not taking at this time  . Diabetes mellitus without complication (HCC) 07/2016   Review of Systems:   Review of Systems    Constitutional: Negative for diaphoresis and fever.  Respiratory: Negative for shortness of breath.   Cardiovascular: Negative for chest pain and leg swelling.  Neurological: Negative for dizziness, tingling, sensory change and loss of consciousness.   Physical Exam:  Vitals:   11/23/17 1515  BP: 134/81  Pulse: 72  Temp: 98.4 F (36.9 C)  TempSrc: Oral  SpO2: 100%  Weight: 190 lb 4.8 oz (86.3 kg)  Height: 5\' 9"  (1.753 m)   Physical Exam  Constitutional: He is oriented to person, place, and time. He appears well-developed and well-nourished. No distress.  HENT:  Head: Normocephalic and atraumatic.  Cardiovascular: Normal rate and regular rhythm.  No murmur heard. Pulmonary/Chest: Effort normal. No respiratory distress. He has no wheezes. He has no rales.  Musculoskeletal: He exhibits no edema.  Neurological: He is alert and oriented to person, place, and time.  Skin: Skin is warm. He is not diaphoretic.     Assessment & Plan:   See Encounters Tab for problem based charting.  Patient discussed with Dr. Oswaldo DoneVincent

## 2017-11-23 NOTE — Assessment & Plan Note (Signed)
He reports adherence and tolerance to atorvastatin 20 mg daily. A/P: Continue moderate intensity atorvastatin 20 mg daily in the setting of diabetes.

## 2017-11-24 NOTE — Progress Notes (Signed)
Internal Medicine Clinic Attending  Case discussed with Dr. Patel at the time of the visit.  We reviewed the resident's history and exam and pertinent patient test results.  I agree with the assessment, diagnosis, and plan of care documented in the resident's note.  

## 2017-11-27 ENCOUNTER — Other Ambulatory Visit: Payer: Self-pay | Admitting: Internal Medicine

## 2017-11-27 DIAGNOSIS — Z794 Long term (current) use of insulin: Principal | ICD-10-CM

## 2017-11-27 DIAGNOSIS — E119 Type 2 diabetes mellitus without complications: Secondary | ICD-10-CM

## 2018-01-10 ENCOUNTER — Encounter: Payer: Self-pay | Admitting: *Deleted

## 2018-05-23 ENCOUNTER — Telehealth: Payer: Self-pay | Admitting: Dietician

## 2018-05-26 ENCOUNTER — Encounter: Payer: Self-pay | Admitting: Dietician

## 2018-05-26 NOTE — Telephone Encounter (Signed)
Could not get in contact with patient by phone to discuss CGM. Will mail letter and discuss with him at his janaury appointment with Dr. Criss AlvinePrince.

## 2018-07-12 ENCOUNTER — Ambulatory Visit: Payer: BLUE CROSS/BLUE SHIELD | Admitting: Dietician

## 2018-07-12 ENCOUNTER — Encounter: Payer: BLUE CROSS/BLUE SHIELD | Admitting: Internal Medicine

## 2018-07-12 NOTE — Progress Notes (Deleted)
   CC: ***  HPI:  Mr.Steaven E Berchtold is a 48 y.o. male with type 2 diabetes and hyperlipidemia  Type 2 diabetes: Currently taking metformin thousand milligrams twice daily and Lantus 20 units nightly. Repeat A1c *** - Ophthalmology eye exam  Hyperlipidemia: Currently on atorvastatin 20 mg daily.  Past Medical History:  Diagnosis Date  . Anxiety    takes Hydroxyzine  . Depression    has been on Effexor, not taking at this time  . Diabetes mellitus without complication (HCC) 07/2016   Review of Systems:  ***  Physical Exam:  There were no vitals filed for this visit. ***  Assessment & Plan:   See Encounters Tab for problem based charting.  Patient {GC/GE:[id number]::"discussed with","seen with"} Dr. {NAMES:[id number]::"Butcher","Granfortuna","E. Hoffman","Klima","Mullen","Narendra","Raines","Vincent"}

## 2018-09-11 LAB — HEMOGLOBIN A1C: A1c: 5.9

## 2018-09-25 ENCOUNTER — Other Ambulatory Visit: Payer: Self-pay | Admitting: *Deleted

## 2018-09-25 DIAGNOSIS — E119 Type 2 diabetes mellitus without complications: Secondary | ICD-10-CM

## 2018-09-25 DIAGNOSIS — Z794 Long term (current) use of insulin: Principal | ICD-10-CM

## 2018-09-27 MED ORDER — INSULIN GLARGINE 100 UNIT/ML SOLOSTAR PEN: PEN_INJECTOR | SUBCUTANEOUS | 0 refills | Status: DC

## 2018-09-27 NOTE — Telephone Encounter (Signed)
Will send RX with no refills. He will need to set up an appointment with me prior to receiving anymore refills.

## 2018-10-04 ENCOUNTER — Encounter: Payer: Self-pay | Admitting: Internal Medicine

## 2018-10-04 ENCOUNTER — Other Ambulatory Visit: Payer: Self-pay

## 2018-10-04 ENCOUNTER — Telehealth: Payer: Self-pay

## 2018-10-04 ENCOUNTER — Ambulatory Visit (INDEPENDENT_AMBULATORY_CARE_PROVIDER_SITE_OTHER): Payer: Self-pay | Admitting: Internal Medicine

## 2018-10-04 DIAGNOSIS — I10 Essential (primary) hypertension: Secondary | ICD-10-CM

## 2018-10-04 MED ORDER — AMLODIPINE BESYLATE 10 MG PO TABS: 10.0000 mg | ORAL_TABLET | Freq: Every day | ORAL | 0 refills | Status: DC

## 2018-10-04 NOTE — Assessment & Plan Note (Signed)
   HTN: has an automatic cuff that he puts on his arm.  He has recorded it for 3-4 days, he has had readings in the mid 150 range systolic for the last few days, one in the 130's had not been checking it in the past.  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  He needs to be started on a blood pressure medicine and to return a form in to the DOT.  He was dizzy for the last few days due to HTN which has resolved and he would like a note for work.    P: Work note, amlodipine 10mg  daily.

## 2018-10-04 NOTE — Progress Notes (Signed)
   [facility] Internal Medicine Residency Telephone Encounter  Reason for call:   This telephone encounter was created for [name] on 10/04/2018 for the following purpose/cc HTN.   Pertinent Data:   T2DM DKA prone, HLD ROS: Pulmonary: pt denies increased work of breathing, shortness of breath,  Cardiac: pt denies palpitations, chest pain,   Abdominal: pt denies abdominal pain, nausea, vomiting, or diarrhea   Assessment / Plan / Recommendations:   HTN: has an automatic cuff that he puts on his arm.  He has recorded it for 3-4 days, he has had readings in the mid 150 range systolic for the last few days, one in the 130's had not been checking it in the past.  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  He needs to be started on a blood pressure medicine and to return a form in to the DOT.  He was dizzy for the last few days due to HTN which has resolved and he would like a note for work.    P: Work note, amlodipine 10mg  daily.    As always, pt is advised that if symptoms worsen or new symptoms arise, they should go to an urgent care facility or to to ER for further evaluation.   Consent and Medical Decision Making:   Patient discussed with Dr. Rogelia Boga  This is a telephone encounter between Joshua [name] and Thornell Mule on 10/04/2018 for HTN. The visit was conducted with the patient located at home and Thornell Mule at [facility]. The patient's identity was confirmed using their DOB and current address. The patient has consented to being evaluated through a telephone encounter and understands the associated risks (an examination cannot be done and the patient may need to come in for an appointment) / benefits (allows the patient to remain at home, decreasing exposure to coronavirus). I personally spent 15 minutes on medical discussion.

## 2018-10-04 NOTE — Telephone Encounter (Signed)
Pt states his bp is high, requesting med for bp. Please call back.

## 2018-10-04 NOTE — Telephone Encounter (Signed)
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 Stated his BP today is 158 / 90. Explained tele health appt - scheduled an appt for today @ 1115 AM.

## 2018-10-05 NOTE — Progress Notes (Signed)
Internal Medicine Clinic Attending  Case discussed with Dr. Frances Furbish at the time of the visit.  We reviewed the resident's history and exam and pertinent patient test results.  I agree with the assessment, diagnosis, and plan of care documented in the resident's note.  Dr. Cyndie Chime plan I discussed the best first antihypertensive to start.  Normally, since he is a diabetic, an ACE or an ARB would be the drug of choice but this requires phlebotomy and we are trying to keep healthy people out of the hospital at this time.  Additionally, a recent microalbumin showed no proteinuria.  The need for monitoring is also the reason why we did not use a diuretic.  Dr. Starr Sinclair choice of amlodipine is a good choice as it will not require any visits to our lab for monitoring.

## 2018-11-22 ENCOUNTER — Other Ambulatory Visit: Payer: Self-pay

## 2018-11-22 ENCOUNTER — Ambulatory Visit: Payer: 59 | Admitting: Internal Medicine

## 2018-11-22 ENCOUNTER — Encounter: Payer: Self-pay | Admitting: Internal Medicine

## 2018-11-22 VITALS — BP 137/87 | HR 80 | Temp 98.9°F | Ht 69.0 in | Wt 186.9 lb

## 2018-11-22 DIAGNOSIS — E1169 Type 2 diabetes mellitus with other specified complication: Secondary | ICD-10-CM

## 2018-11-22 DIAGNOSIS — E119 Type 2 diabetes mellitus without complications: Secondary | ICD-10-CM

## 2018-11-22 DIAGNOSIS — I10 Essential (primary) hypertension: Secondary | ICD-10-CM

## 2018-11-22 DIAGNOSIS — Z794 Long term (current) use of insulin: Secondary | ICD-10-CM | POA: Diagnosis not present

## 2018-11-22 DIAGNOSIS — Z79899 Other long term (current) drug therapy: Secondary | ICD-10-CM

## 2018-11-22 DIAGNOSIS — E785 Hyperlipidemia, unspecified: Secondary | ICD-10-CM | POA: Diagnosis not present

## 2018-11-22 LAB — POCT GLYCOSYLATED HEMOGLOBIN (HGB A1C): Hemoglobin A1C: 5.8 % — AB (ref 4.0–5.6)

## 2018-11-22 LAB — GLUCOSE, CAPILLARY: Glucose-Capillary: 97 mg/dL (ref 70–99)

## 2018-11-22 MED ORDER — LISINOPRIL 5 MG PO TABS: 5.0000 mg | ORAL_TABLET | Freq: Every day | ORAL | 3 refills | Status: DC

## 2018-11-22 MED ORDER — ATORVASTATIN CALCIUM 40 MG PO TABS: 40.0000 mg | ORAL_TABLET | Freq: Every day | ORAL | 3 refills | Status: DC

## 2018-11-22 NOTE — Progress Notes (Signed)
   CC: Follow-up of chronic medical problems  HPI:  [name] is a 48 y.o. male with hypertension, type 2 diabetes, and hyperlipidemia who presents for follow-up of his chronic medical problems.   Past Medical History:  Diagnosis Date  . Anxiety    takes Hydroxyzine  . Depression    has been on Effexor, not taking at this time  . Diabetes mellitus without complication (HCC) 07/2016   Review of Systems:   Review of Systems  Constitutional: Negative for chills and fever.  HENT: Negative for congestion and sore throat.   Respiratory: Negative for cough and shortness of breath.   Cardiovascular: Negative for chest pain, palpitations, claudication and leg swelling.  Gastrointestinal: Negative for abdominal pain, diarrhea, nausea and vomiting.  Genitourinary: Negative for dysuria and hematuria.  Skin: Negative for itching and rash.    Physical Exam:  Vitals:   11/22/18 1438 11/22/18 1526  BP: (!) 144/83 137/87  Pulse: 82 80  Temp: 98.9 F (37.2 C)   TempSrc: Oral   SpO2: 100%   Weight: 186 lb 14.4 oz (84.8 kg)   Height: 5\' 9"  (1.753 m)    Physical Exam Vitals signs and nursing note reviewed.  Constitutional:      Appearance: Normal appearance.  HENT:     Head: Normocephalic and atraumatic.     Mouth/Throat:     Mouth: Mucous membranes are moist.     Pharynx: Oropharynx is clear.  Eyes:     Extraocular Movements: Extraocular movements intact.     Pupils: Pupils are equal, round, and reactive to light.  Cardiovascular:     Rate and Rhythm: Normal rate and regular rhythm.  Pulmonary:     Effort: Pulmonary effort is normal. No respiratory distress.     Breath sounds: Normal breath sounds.  Abdominal:     General: Abdomen is flat. Bowel sounds are normal.     Palpations: Abdomen is soft.  Skin:    General: Skin is warm and dry.  Neurological:     Mental Status: He is alert and oriented to person, place, and time. Mental status is at baseline.   Psychiatric:        Mood and Affect: Mood normal.        Behavior: Behavior normal.     Assessment & Plan:   See Encounters Tab for problem based charting.  Patient discussed with Dr. Cleda Daub

## 2018-11-28 NOTE — Assessment & Plan Note (Signed)
Assessment/plan: He reports good compliance with atorvastatin 20 mg daily.  He does however have an eye ASCVD risk score of 27.5%.  Will increase his atorvastatin to 40 mg daily (high intensity statin dose).

## 2018-11-28 NOTE — Assessment & Plan Note (Signed)
Assessment/plan: He is currently being followed by Dr. Buddy Duty (endocrinology).  Last hemoglobin A1c on 08/25/2018 was 5.5. A1c today is 5.8.  Home medications include Lantus 20 units daily but he has been out of this medication for 2 weeks.  He has been taking his metformin 1000 mg twice daily without difficulty.  He denies any acute hypoglycemic events.  I do believe that it is reasonable to hold off on restarting Lantus as his A1c is well below goal.  We will continue with metformin alone.  I have instructed him to check his CBGs 2-3 times per day and to call the clinic if he notices that his blood sugars remain above 200 consistently.  He expresses understanding and agreement.

## 2018-11-28 NOTE — Assessment & Plan Note (Signed)
Assessment/plan: He was recently started on amlodipine 10 mg daily.  Blood pressure today is above goal at 144/83. Will start lisinopril 5 mg today and have him follow-up in 1 to 2 months. -Continue amlodipine 10 mg daily -Start lisinopril 5 mg daily

## 2018-12-04 NOTE — Progress Notes (Signed)
Internal Medicine Clinic Attending  Case discussed with Dr. Prince at the time of the visit.  We reviewed the resident's history and exam and pertinent patient test results.  I agree with the assessment, diagnosis, and plan of care documented in the resident's note.   

## 2018-12-10 ENCOUNTER — Encounter: Payer: Self-pay | Admitting: *Deleted

## 2019-01-01 ENCOUNTER — Other Ambulatory Visit: Payer: Self-pay | Admitting: Internal Medicine

## 2019-01-01 DIAGNOSIS — I10 Essential (primary) hypertension: Secondary | ICD-10-CM

## 2019-01-18 ENCOUNTER — Encounter: Payer: Self-pay | Admitting: Internal Medicine

## 2019-01-18 ENCOUNTER — Ambulatory Visit: Payer: 59 | Admitting: Internal Medicine

## 2019-01-18 ENCOUNTER — Other Ambulatory Visit: Payer: Self-pay

## 2019-01-18 VITALS — BP 137/84 | HR 72 | Temp 98.2°F | Ht 69.0 in | Wt 184.6 lb

## 2019-01-18 DIAGNOSIS — I10 Essential (primary) hypertension: Secondary | ICD-10-CM | POA: Diagnosis not present

## 2019-01-18 DIAGNOSIS — E785 Hyperlipidemia, unspecified: Secondary | ICD-10-CM | POA: Diagnosis not present

## 2019-01-18 DIAGNOSIS — E1129 Type 2 diabetes mellitus with other diabetic kidney complication: Secondary | ICD-10-CM

## 2019-01-18 DIAGNOSIS — Z79899 Other long term (current) drug therapy: Secondary | ICD-10-CM

## 2019-01-18 DIAGNOSIS — E1169 Type 2 diabetes mellitus with other specified complication: Secondary | ICD-10-CM | POA: Diagnosis not present

## 2019-01-18 DIAGNOSIS — E119 Type 2 diabetes mellitus without complications: Secondary | ICD-10-CM

## 2019-01-18 DIAGNOSIS — Z7984 Long term (current) use of oral hypoglycemic drugs: Secondary | ICD-10-CM | POA: Diagnosis not present

## 2019-01-18 DIAGNOSIS — Z794 Long term (current) use of insulin: Secondary | ICD-10-CM

## 2019-01-18 MED ORDER — AMLODIPINE BESYLATE 10 MG PO TABS: ORAL_TABLET | ORAL | 3 refills | Status: DC

## 2019-01-18 MED ORDER — LISINOPRIL 5 MG PO TABS: 5.0000 mg | ORAL_TABLET | Freq: Every day | ORAL | 3 refills | Status: DC

## 2019-01-18 NOTE — Progress Notes (Signed)
   CC: Hypertension  HPI:  [name] is a 48 y.o. male with past medical history significant for hypertension, diabetes, hyperlipidemia.  Patient presents today for recheck on his hypertension.  Patient was supposed to be started on lisinopril 5 mg at last visit however patient notes that the medication did not get sent to his pharmacy and therefore has not been taking it.  He has been taking his blood pressure at home and has found it to be in the low 130s.  We also discussed his diabetic medications.  Patient denies having any issues with side effects with metformin.  He is no longer taking Lantus.  Patient notes that he has achieved significantly better control of his diabetes with lifestyle changes in addition to the metformin.  Past Medical History:  Diagnosis Date  . Anxiety    takes Hydroxyzine  . Depression    has been on Effexor, not taking at this time  . Diabetes mellitus without complication ([place]) 81/8299   Review of Systems: Patient denies any recent fever, chills, nausea, vomiting, diarrhea, constipation, heat or cold allergies, cough, shortness of breath or chest pain Physical Exam:  Vitals:   01/18/19 1046  BP: 137/84  Pulse: 72  Temp: 98.2 F (36.8 C)  TempSrc: Oral  SpO2: 100%  Weight: 184 lb 9.6 oz (83.7 kg)  Height: 5\' 9"  (1.753 m)    GENERAL: well appearing, in no apparent distress CARDIAC: heart regular rate and rhythm, no peripheral edema appreciated PULMONARY: lung sounds clear to auscultation SKIN: no rash or lesion on limited exam    Assessment & Plan:   See Encounters Tab for problem based charting.  Pertinent labs & imaging results that were available during my care of the patient were reviewed by me and considered in my medical decision making  Patient is in agreement with the plan and endorses no further questions at this time.  Patient seen with Dr. Elwanda Brooklyn, MD Internal Medicine Resident-PGY1 01/18/19

## 2019-01-18 NOTE — Assessment & Plan Note (Signed)
Medications: Atorvastatin 40 mg  Plan: Continue current regimen.

## 2019-01-18 NOTE — Assessment & Plan Note (Addendum)
Medications:  amlodipine 10 mg daily  Patient was supposed to be started on lisinopril 5 mg at last appointment due to continued hypertension.  Unfortunately, medication was not not received at the pharmacy and therefore patient has been taking it.  Today blood pressure is 137/84.  He notes that his blood pressure at home has been in the 130s.  Plan: Continue amlodipine.  Although blood pressure is within a reasonable range, will try starting him on the lisinopril for its renal protective effect.  Potential side effects discussed with patient.  Sent lisinopril 5 mg to his pharmacy.  Blood pressure recheck with labs in 4 to 6 weeks.

## 2019-01-18 NOTE — Patient Instructions (Signed)
I sent the blood pressure medicine into your pharmacy. Please let me know if you do not receive it. Additionally, I will have you come back in 4-6 weeks so we can recheck your blood pressure.  I decreased your metformin to 500mg  twice a day. We will recheck your A1C in 3 months or so and see how you are doing on that.

## 2019-01-18 NOTE — Assessment & Plan Note (Signed)
Medications: Metformin 1000 mg twice daily  A1c from June/2020 was 5.8.  Patient is noted to have a A1c of 13.1 in 2018 and it was at that time they got started on anti-glycemic medications.  Patient notes since the time of this initial diagnosis, he is been working very hard on diet and exercise.  Plan: We will try decreasing metformin to 500 mg twice daily.  Recheck A1c in 3 months. Ophthalmology referral sent

## 2019-01-19 NOTE — Progress Notes (Signed)
Internal Medicine Clinic Attending  I saw and evaluated the patient.  I personally confirmed the key portions of the history and exam documented by Dr. Christian   and I reviewed pertinent patient test results.  The assessment, diagnosis, and plan were formulated together and I agree with the documentation in the resident's note.  

## 2019-01-23 ENCOUNTER — Encounter: Payer: Self-pay | Admitting: *Deleted

## 2019-01-23 ENCOUNTER — Telehealth: Payer: Self-pay | Admitting: *Deleted

## 2019-01-23 NOTE — Telephone Encounter (Signed)
Called patient left voice message for patient to return call .  Needing to speak with patient regarding his insurance coverage for this referral or if he able to pay out of pocket.  Will send letter to patient.

## 2019-02-22 ENCOUNTER — Encounter: Payer: Self-pay | Admitting: Internal Medicine

## 2019-03-01 ENCOUNTER — Encounter: Payer: Self-pay | Admitting: Internal Medicine

## 2019-03-01 ENCOUNTER — Other Ambulatory Visit: Payer: Self-pay

## 2019-03-01 ENCOUNTER — Ambulatory Visit: Payer: 59 | Admitting: Internal Medicine

## 2019-03-01 VITALS — BP 139/76 | HR 77 | Temp 98.4°F | Ht 72.0 in | Wt 182.1 lb

## 2019-03-01 DIAGNOSIS — R809 Proteinuria, unspecified: Secondary | ICD-10-CM | POA: Diagnosis not present

## 2019-03-01 DIAGNOSIS — R110 Nausea: Secondary | ICD-10-CM

## 2019-03-01 DIAGNOSIS — R42 Dizziness and giddiness: Secondary | ICD-10-CM | POA: Diagnosis not present

## 2019-03-01 DIAGNOSIS — E119 Type 2 diabetes mellitus without complications: Secondary | ICD-10-CM

## 2019-03-01 DIAGNOSIS — I10 Essential (primary) hypertension: Secondary | ICD-10-CM | POA: Diagnosis not present

## 2019-03-01 DIAGNOSIS — R1084 Generalized abdominal pain: Secondary | ICD-10-CM

## 2019-03-01 DIAGNOSIS — E1169 Type 2 diabetes mellitus with other specified complication: Secondary | ICD-10-CM | POA: Diagnosis not present

## 2019-03-01 DIAGNOSIS — E1129 Type 2 diabetes mellitus with other diabetic kidney complication: Secondary | ICD-10-CM | POA: Diagnosis not present

## 2019-03-01 DIAGNOSIS — Z794 Long term (current) use of insulin: Secondary | ICD-10-CM

## 2019-03-01 DIAGNOSIS — Z79899 Other long term (current) drug therapy: Secondary | ICD-10-CM

## 2019-03-01 DIAGNOSIS — E785 Hyperlipidemia, unspecified: Secondary | ICD-10-CM

## 2019-03-01 DIAGNOSIS — Z7984 Long term (current) use of oral hypoglycemic drugs: Secondary | ICD-10-CM

## 2019-03-01 LAB — POCT GLYCOSYLATED HEMOGLOBIN (HGB A1C): Hemoglobin A1C: 5.7 % — AB (ref 4.0–5.6)

## 2019-03-01 LAB — GLUCOSE, CAPILLARY: Glucose-Capillary: 116 mg/dL — ABNORMAL HIGH (ref 70–99)

## 2019-03-01 LAB — LIPASE, BLOOD: Lipase: 27 U/L (ref 11–51)

## 2019-03-01 MED ORDER — ATORVASTATIN CALCIUM 40 MG PO TABS: 40.0000 mg | ORAL_TABLET | Freq: Every day | ORAL | 3 refills | Status: DC

## 2019-03-01 MED ORDER — METFORMIN HCL 1000 MG PO TABS: 1000.0000 mg | ORAL_TABLET | Freq: Two times a day (BID) | ORAL | 3 refills | Status: DC

## 2019-03-01 MED ORDER — OMEPRAZOLE 20 MG PO CPDR: 20.0000 mg | DELAYED_RELEASE_CAPSULE | Freq: Every day | ORAL | 1 refills | Status: DC

## 2019-03-01 MED ORDER — METFORMIN HCL 500 MG PO TABS: 500.0000 mg | ORAL_TABLET | Freq: Two times a day (BID) | ORAL | 1 refills | Status: DC

## 2019-03-01 NOTE — Progress Notes (Signed)
     CC: diabetes  SUBJECTIVE: 48 y.o. male for follow up of diabetes.   See problem based charting for further documentation.    Current Outpatient Medications  Medication Sig Dispense Refill  . ACCU-CHEK SOFTCLIX LANCETS lancets USE TO TEST BLOOD SUGAR UP TO FOUR TIMES DAILY AS DIRECTED 100 each 5  . amLODipine (NORVASC) 10 MG tablet TAKE 1 TABLET(10 MG) BY MOUTH DAILY 90 tablet 3  . atorvastatin (LIPITOR) 40 MG tablet Take 1 tablet (40 mg total) by mouth daily. 30 tablet 3  . B-D UF III MINI PEN NEEDLES 31G X 5 MM MISC USE AS DIRECTED FOUR TIMES DAILY 100 each 5  . blood glucose meter kit and supplies KIT Dispense based on patient and insurance preference. Use up to four times daily as directed. (FOR ICD-9 250.00, 250.01). 1 each 0  . glucose blood (ACCU-CHEK AVIVA PLUS) test strip Use to Test Blood Sugars up to Four Times a Day. Code E11.9. 100 each 2  . metFORMIN (GLUCOPHAGE) 500 MG tablet Take 1 tablet (500 mg total) by mouth 2 (two) times daily with a meal. 180 tablet 1  . omeprazole (PRILOSEC) 20 MG capsule Take 1 capsule (20 mg total) by mouth daily. 30 capsule 1   No current facility-administered medications for this visit.     Past Medical History:  Diagnosis Date  . Anxiety    takes Hydroxyzine  . Depression    has been on Effexor, not taking at this time  . Diabetes mellitus without complication ([place]) 61/5488   Review of Systems:  No polyuria, polydipsia, blurry vision, chest pain, dyspnea or claudication.  + lightheadedness. No foot burning, numbness or pain. + nausea. No emesis, diarrhea, constipation or bowel changes.  OBJECTIVE:  BP 139/76 (BP Location: Right Arm, Patient Position: Sitting, Cuff Size: Normal)   Pulse 77   Temp 98.4 F (36.9 C) (Oral)   Ht 6' (1.829 m)   Wt 182 lb 1.6 oz (82.6 kg)   SpO2 100%   BMI 24.70 kg/m    GENERAL: well appearing, in no apparent distress CARDIAC: heart regular rate and rhythm PULMONARY: lung sounds clear to  auscultation ABDOMEN: bowel sounds active. Soft, nontender. Non-distended. Liver feels slightly enlarged.    Assessment & Plan:    See orders for this visit as documented in the electronic medical record.  See Encounters Tab for problem based charting.  Patient seen with Dr. Adolm Joseph, MD Internal Medicine Resident-PGY1 03/03/19

## 2019-03-01 NOTE — Assessment & Plan Note (Addendum)
Medications: metformin 1000mg  bid. lipitor 40mg  daily medication compliance:  compliant most of the time,  Taking medication compliantly without noted sided effects. 0 hypoglycemic events noted. Follows diet fairly well.  Due for retinal exam Last foot exam negative within the past year. A1C today: 5.7 Diabetes Mellitus: well controlled  Plan: will decrease metformin to 500mg  bid. Follow up 3 mo with a1c recheck. Referral to opthamology

## 2019-03-01 NOTE — Assessment & Plan Note (Addendum)
Medications:amlodipine 10mg   and lisinopril 5mg   Pt reports compliance with medication. no medication side effects  BP noted to be well controlled today in office Plan: Will discontinue lisinopril for now due to nausea/light headedness. BMP today

## 2019-03-01 NOTE — Patient Instructions (Signed)
It was nice seeing you again today.  I have ordered some lab work to try to identify why you are nauseated. I also sent some medication to your pharmacy. I ordered an abdominal ultrasound to take a look at your liver. I will call you with the results of all of this.  Take Care, Mitzi Hansen, MD

## 2019-03-01 NOTE — Assessment & Plan Note (Signed)
Medications:Lipitor (atorvastatin)40mg . Pt reports compliance with statin. Denies side effects. Encouraged low cholesterol diet and exercise.  Plan: continue current management. Lipid panel today Repeat lipid panel in 1-2 years.

## 2019-03-02 ENCOUNTER — Telehealth: Payer: Self-pay | Admitting: Internal Medicine

## 2019-03-02 LAB — HEPATIC FUNCTION PANEL
ALT: 23 IU/L (ref 0–44)
AST: 20 IU/L (ref 0–40)
Albumin: 5 g/dL (ref 4.0–5.0)
Alkaline Phosphatase: 96 IU/L (ref 39–117)
Bilirubin Total: 0.6 mg/dL (ref 0.0–1.2)
Bilirubin, Direct: 0.15 mg/dL (ref 0.00–0.40)
Total Protein: 7.8 g/dL (ref 6.0–8.5)

## 2019-03-02 LAB — CBC WITH DIFFERENTIAL/PLATELET
Basophils Absolute: 0.1 10*3/uL (ref 0.0–0.2)
Basos: 1 %
EOS (ABSOLUTE): 0.1 10*3/uL (ref 0.0–0.4)
Eos: 2 %
Hematocrit: 47.6 % (ref 37.5–51.0)
Hemoglobin: 17 g/dL (ref 13.0–17.7)
Immature Grans (Abs): 0 10*3/uL (ref 0.0–0.1)
Immature Granulocytes: 0 %
Lymphocytes Absolute: 2.1 10*3/uL (ref 0.7–3.1)
Lymphs: 26 %
MCH: 33.3 pg — ABNORMAL HIGH (ref 26.6–33.0)
MCHC: 35.7 g/dL (ref 31.5–35.7)
MCV: 93 fL (ref 79–97)
Monocytes Absolute: 0.8 10*3/uL (ref 0.1–0.9)
Monocytes: 9 %
Neutrophils Absolute: 5 10*3/uL (ref 1.4–7.0)
Neutrophils: 62 %
Platelets: 277 10*3/uL (ref 150–450)
RBC: 5.11 x10E6/uL (ref 4.14–5.80)
RDW: 13.2 % (ref 11.6–15.4)
WBC: 8.1 10*3/uL (ref 3.4–10.8)

## 2019-03-02 LAB — BMP8+ANION GAP
Anion Gap: 18 mmol/L (ref 10.0–18.0)
BUN/Creatinine Ratio: 15 (ref 9–20)
BUN: 13 mg/dL (ref 6–24)
CO2: 21 mmol/L (ref 20–29)
Calcium: 9.9 mg/dL (ref 8.7–10.2)
Chloride: 97 mmol/L (ref 96–106)
Creatinine, Ser: 0.86 mg/dL (ref 0.76–1.27)
GFR calc Af Amer: 119 mL/min/{1.73_m2} (ref >59)
GFR calc non Af Amer: 103 mL/min/{1.73_m2} (ref >59)
Glucose: 98 mg/dL (ref 65–99)
Potassium: 4.2 mmol/L (ref 3.5–5.2)
Sodium: 136 mmol/L (ref 134–144)

## 2019-03-02 LAB — LIPID PANEL
Chol/HDL Ratio: 3.5 ratio (ref 0.0–5.0)
Cholesterol, Total: 170 mg/dL (ref 100–199)
HDL: 49 mg/dL (ref >39)
LDL Chol Calc (NIH): 98 mg/dL (ref 0–99)
Triglycerides: 132 mg/dL (ref 0–149)
VLDL Cholesterol Cal: 23 mg/dL (ref 5–40)

## 2019-03-02 NOTE — Telephone Encounter (Signed)
Pt states that he forgot to get his doctor's note from yesterday, pt request if we send it to [facility] he can print it off [phone]

## 2019-03-03 ENCOUNTER — Encounter: Payer: Self-pay | Admitting: Internal Medicine

## 2019-03-03 DIAGNOSIS — R110 Nausea: Secondary | ICD-10-CM | POA: Insufficient documentation

## 2019-03-03 NOTE — Assessment & Plan Note (Addendum)
Patient notes one week history of constant nausea. Endorses intermittent light headedness  as well. No vomiting or diarhhea. Denies abdominal pain, fever, chills. Sx are not exacerbated by food. He is able to eat/drink without issue. Does endorse sx started after drinking a 6 pack on the night prior to onset of sx. He started taking  Lisinopril about 2 weeks ago as well. On PE, liver is palpable about 4-6cm below CPA.  Likely alcohol related gastritis. Pancreatitis vs infection (hepatic or stomach) vs medication related Plan:  prilosec 20mg  daily Will obtain BMP, LFTs and lipase as well RUQ [place] Encouraged him to reach out to me next week to let me know how he is feeling.

## 2019-03-04 NOTE — Progress Notes (Signed)
Internal Medicine Clinic Attending  I saw and evaluated the patient.  I personally confirmed the key portions of the history and exam documented by Dr. Christian   and I reviewed pertinent patient test results.  The assessment, diagnosis, and plan were formulated together and I agree with the documentation in the resident's note.  

## 2019-03-20 ENCOUNTER — Encounter: Payer: Self-pay | Admitting: *Deleted

## 2019-03-20 LAB — HM DIABETES EYE EXAM

## 2019-03-22 ENCOUNTER — Ambulatory Visit (HOSPITAL_COMMUNITY): Payer: 59

## 2019-03-31 ENCOUNTER — Other Ambulatory Visit: Payer: Self-pay | Admitting: Internal Medicine

## 2019-03-31 DIAGNOSIS — I10 Essential (primary) hypertension: Secondary | ICD-10-CM

## 2020-01-25 ENCOUNTER — Other Ambulatory Visit: Payer: Self-pay | Admitting: *Deleted

## 2020-01-25 DIAGNOSIS — I10 Essential (primary) hypertension: Secondary | ICD-10-CM

## 2020-01-25 MED ORDER — AMLODIPINE BESYLATE 10 MG PO TABS: ORAL_TABLET | ORAL | 1 refills | Status: DC

## 2020-12-01 ENCOUNTER — Encounter: Payer: 59 | Admitting: Internal Medicine

## 2020-12-02 ENCOUNTER — Encounter: Payer: Self-pay | Admitting: Internal Medicine

## 2020-12-23 ENCOUNTER — Encounter: Payer: Self-pay | Admitting: *Deleted

## 2021-01-24 ENCOUNTER — Other Ambulatory Visit: Payer: Self-pay | Admitting: Student

## 2021-01-24 DIAGNOSIS — I10 Essential (primary) hypertension: Secondary | ICD-10-CM

## 2021-01-26 NOTE — Telephone Encounter (Signed)
Refill Request   amLODipine amLODipine (NORVASC) 10 MG tablet  TAKE 1 TABLET BY MOUTH EVERY DAY, Normal  Dispense: 90 tablet  Refills: 1 ordered  Pharmacy: [id number] [place], [place] - [address] AT [facility] (Ph: [phone])

## 2021-01-26 NOTE — Telephone Encounter (Signed)
Pt last OV was 03/01/19. I called pt to schedule an appt in order to continue receiving refills. Informed pt to keep the appt - call transferred to the front office. Appt scheduled 8/10 with Dr Cyndie Chime - no available appts with yellow team.

## 2021-01-26 NOTE — Telephone Encounter (Signed)
Last visit 03/01/2019 Refill request denied at this time Pt needs an appt-message sent to front office to assist with scheduling

## 2021-01-28 ENCOUNTER — Encounter: Payer: Self-pay | Admitting: Student

## 2021-01-28 ENCOUNTER — Ambulatory Visit (INDEPENDENT_AMBULATORY_CARE_PROVIDER_SITE_OTHER): Payer: Managed Care, Other (non HMO) | Admitting: Student

## 2021-01-28 VITALS — BP 134/81 | HR 68 | Temp 98.1°F | Ht 72.0 in | Wt 197.2 lb

## 2021-01-28 DIAGNOSIS — E1129 Type 2 diabetes mellitus with other diabetic kidney complication: Secondary | ICD-10-CM | POA: Diagnosis not present

## 2021-01-28 DIAGNOSIS — E1169 Type 2 diabetes mellitus with other specified complication: Secondary | ICD-10-CM | POA: Diagnosis not present

## 2021-01-28 DIAGNOSIS — Z1211 Encounter for screening for malignant neoplasm of colon: Secondary | ICD-10-CM

## 2021-01-28 DIAGNOSIS — E119 Type 2 diabetes mellitus without complications: Secondary | ICD-10-CM

## 2021-01-28 DIAGNOSIS — Z0000 Encounter for general adult medical examination without abnormal findings: Secondary | ICD-10-CM | POA: Diagnosis not present

## 2021-01-28 DIAGNOSIS — R809 Proteinuria, unspecified: Secondary | ICD-10-CM | POA: Diagnosis not present

## 2021-01-28 DIAGNOSIS — Z23 Encounter for immunization: Secondary | ICD-10-CM

## 2021-01-28 DIAGNOSIS — I10 Essential (primary) hypertension: Secondary | ICD-10-CM

## 2021-01-28 DIAGNOSIS — E785 Hyperlipidemia, unspecified: Secondary | ICD-10-CM

## 2021-01-28 DIAGNOSIS — Z794 Long term (current) use of insulin: Secondary | ICD-10-CM

## 2021-01-28 LAB — POCT GLYCOSYLATED HEMOGLOBIN (HGB A1C): Hemoglobin A1C: 6.6 % — AB (ref 4.0–5.6)

## 2021-01-28 LAB — GLUCOSE, CAPILLARY: Glucose-Capillary: 162 mg/dL — ABNORMAL HIGH (ref 70–99)

## 2021-01-28 MED ORDER — METFORMIN HCL 500 MG PO TABS: 500.0000 mg | ORAL_TABLET | Freq: Every day | ORAL | 1 refills | Status: DC

## 2021-01-28 MED ORDER — AMLODIPINE BESYLATE 10 MG PO TABS: ORAL_TABLET | ORAL | 1 refills | Status: DC

## 2021-01-28 NOTE — Assessment & Plan Note (Addendum)
-   Declined colonoscopy.  Will do FIT test - Tdap given - Hepatitis C check - came back negative

## 2021-01-28 NOTE — Progress Notes (Signed)
   CC: Medication refill.  Follow-up on diabetes and hypertension  HPI:  [name] is a 50 y.o. past history of hypertension and type 2 diabetes who presents to clinic for routine follow-up.  Please see problem based charting for detail  Past Medical History:  Diagnosis Date   Anxiety    takes Hydroxyzine   Depression    has been on Effexor, not taking at this time   Diabetes mellitus without complication ([facility]) 07/2016   Review of Systems:   Per HPI  Physical Exam:  Vitals:   01/28/21 1414 01/28/21 1445  BP: (!) 147/86 134/81  Pulse: 85 68  Temp: 98.1 F (36.7 C)   TempSrc: Oral   SpO2: 100%   Weight: 197 lb 3.2 oz (89.4 kg)   Height: 6' (1.829 m)    Physical Exam Constitutional:      General: He is not in acute distress.    Appearance: He is not toxic-appearing.  HENT:     Head: Normocephalic.  Eyes:     Conjunctiva/sclera: Conjunctivae normal.  Cardiovascular:     Rate and Rhythm: Normal rate and regular rhythm.  Pulmonary:     Effort: Pulmonary effort is normal. No respiratory distress.     Breath sounds: Normal breath sounds. No wheezing.  Abdominal:     General: Bowel sounds are normal.  Musculoskeletal:        General: Normal range of motion.     Cervical back: Normal range of motion.     Comments: No open wound or ulcer of bilateral foot.  Pulses palpated bilaterally  Skin:    General: Skin is warm.  Neurological:     Mental Status: He is alert and oriented to person, place, and time.  Psychiatric:        Mood and Affect: Mood normal.        Behavior: Behavior normal.     Assessment & Plan:   See Encounters Tab for problem based charting.  Patient discussed with Dr. Criselda Peaches

## 2021-01-28 NOTE — Assessment & Plan Note (Addendum)
Last A1c 5.7.  A1C today 6.6.  Patient actually taking half a tablet of metformin 500 mg instead of 1 tablet twice daily.  Due to GI side effects.  -Advised patient to go up to 1 tablet a day if tolerable. -Advised him on healthier diet choices with less sugar and carbs. -He will call Dr. Alexandria Lodge office for an eye appointment. -Foot exam unremarkable -Urine microalbumin checked today-came back normal

## 2021-01-28 NOTE — Assessment & Plan Note (Addendum)
Check lipid panel today  Addendum Lipid panel showed significantly elevated LDL compared to last lipid panel.  Patient states that he only take atorvastatin 1 or twice a week.  I emphasized the importance of medication adherence.  -Sent refill of atorvastatin to his pharmacy -Recheck lipid panel at next visit

## 2021-01-28 NOTE — Patient Instructions (Addendum)
Joshua [name],  It was a pleasure seeing you today in the clinic.  Here is a summary of what we talked about:  1.  Hypertension: The blood pressure looks great today.  I sent a refill of your amlodipine to your pharmacy.  2.  Type 2 diabetes: Your A1c is 6.6, elevated from your last reading but is still within acceptable range.  Please try to slowly increase your metformin to 1 tablet a day.  Please work on a healthier diet choices with less starch or sugar.  3.  Hyperlipidemia: I will check your cholesterol panel today.  4.  We will give you a kit for you to collect a stool.  Please bring back the sample to our clinic.  Return to clinic in 6 months, sooner if needed.  Take care,  Dr. Alfonse Spruce

## 2021-01-28 NOTE — Assessment & Plan Note (Signed)
Initial blood pressure 147/86.  134/81 when rechecked.  Patient has run out of his amlodipine for 5 days.  Blood pressure is in acceptable range.  -Continue amlodipine 10 mg daily.  Refill sent

## 2021-01-29 LAB — LIPID PANEL
Chol/HDL Ratio: 6.1 ratio — ABNORMAL HIGH (ref 0.0–5.0)
Cholesterol, Total: 221 mg/dL — ABNORMAL HIGH (ref 100–199)
HDL: 36 mg/dL — ABNORMAL LOW (ref >39)
LDL Chol Calc (NIH): 152 mg/dL — ABNORMAL HIGH (ref 0–99)
Triglycerides: 179 mg/dL — ABNORMAL HIGH (ref 0–149)
VLDL Cholesterol Cal: 33 mg/dL (ref 5–40)

## 2021-01-29 LAB — MICROALBUMIN / CREATININE URINE RATIO
Creatinine, Urine: 185 mg/dL
Microalb/Creat Ratio: 9 mg/g creat (ref 0–29)
Microalbumin, Urine: 17 ug/mL

## 2021-01-29 LAB — HEPATITIS C ANTIBODY: Hep C Virus Ab: <0.1 s/co ratio (ref 0.0–0.9)

## 2021-01-29 MED ORDER — ATORVASTATIN CALCIUM 40 MG PO TABS: 40.0000 mg | ORAL_TABLET | Freq: Every day | ORAL | 3 refills | Status: DC

## 2021-01-29 NOTE — Addendum Note (Signed)
Addended byDoran Stabler on: 01/29/2021 04:29 PM   Modules accepted: Orders

## 2021-02-11 NOTE — Progress Notes (Signed)
Internal Medicine Clinic Attending  Case discussed with Dr. Nguyen  At the time of the visit.  We reviewed the resident's history and exam and pertinent patient test results.  I agree with the assessment, diagnosis, and plan of care documented in the resident's note. 

## 2021-06-06 LAB — HM DIABETES EYE EXAM

## 2021-06-23 ENCOUNTER — Emergency Department (HOSPITAL_COMMUNITY)
Admission: EM | Admit: 2021-06-23 | Discharge: 2021-06-23 | Disposition: A | Discharge status: Home / Self Care | Payer: Managed Care, Other (non HMO) | Attending: Emergency Medicine | Admitting: Emergency Medicine

## 2021-06-23 ENCOUNTER — Other Ambulatory Visit: Payer: Self-pay

## 2021-06-23 ENCOUNTER — Encounter (HOSPITAL_COMMUNITY): Payer: Self-pay

## 2021-06-23 DIAGNOSIS — E119 Type 2 diabetes mellitus without complications: Secondary | ICD-10-CM | POA: Insufficient documentation

## 2021-06-23 DIAGNOSIS — Z20822 Contact with and (suspected) exposure to covid-19: Secondary | ICD-10-CM | POA: Diagnosis not present

## 2021-06-23 DIAGNOSIS — J069 Acute upper respiratory infection, unspecified: Secondary | ICD-10-CM | POA: Insufficient documentation

## 2021-06-23 DIAGNOSIS — R059 Cough, unspecified: Secondary | ICD-10-CM | POA: Diagnosis present

## 2021-06-23 LAB — RESP PANEL BY RT-PCR (FLU A&B, COVID) ARPGX2
Influenza A by PCR: NEGATIVE
Influenza B by PCR: NEGATIVE
SARS Coronavirus 2 by RT PCR: NEGATIVE

## 2021-06-23 MED ORDER — FLUTICASONE PROPIONATE 50 MCG/ACT NA SUSP
2.0000 | Freq: Every day | NASAL | 0 refills | Status: DC
Start: 2021-06-23 — End: 2024-03-14

## 2021-06-23 MED ORDER — ALBUTEROL SULFATE HFA 108 (90 BASE) MCG/ACT IN AERS: 1.0000 | INHALATION_SPRAY | Freq: Four times a day (QID) | RESPIRATORY_TRACT | 0 refills | Status: DC | PRN

## 2021-06-23 MED ORDER — BENZONATATE 100 MG PO CAPS: 100.0000 mg | ORAL_CAPSULE | Freq: Three times a day (TID) | ORAL | 0 refills | Status: DC | PRN

## 2021-06-23 NOTE — ED Triage Notes (Signed)
Patient complains of sore throat, general fatigue, cough for 3 days, family all tested positive for flu

## 2021-06-23 NOTE — ED Provider Notes (Signed)
Emergency Department Provider Note   I have reviewed the triage vital signs and the nursing notes.   HISTORY  Chief Complaint No chief complaint on file.   HPI Joshua [name] is a 51 y.o. male past medical history reviewed below presents to the emergency department with upper respiratory infection symptoms.  He states he has had contact with 2 other individuals who have tested positive for the flu.  He has had several days of congestion with cough, sore throat, body aches.  He is not feeling pain in the chest or trouble breathing.  No radiation of symptoms or other modifying factors.   Past Medical History:  Diagnosis Date   Anxiety    takes Hydroxyzine   Depression    has been on Effexor, not taking at this time   Diabetes mellitus without complication ([facility]) 07/2016    Review of Systems  Constitutional: No fever/chills. Positive body aches and chills.  Eyes: No visual changes. ENT: Positive sore throat. Cardiovascular: Denies chest pain. Respiratory: Denies shortness of breath. Positive cough.  Gastrointestinal: No abdominal pain.  No nausea, no vomiting.  No diarrhea.  No constipation. Genitourinary: Negative for dysuria. Musculoskeletal: Negative for back pain. Skin: Negative for rash. Neurological: Negative for focal weakness or numbness. Positive HA.   10-point ROS otherwise negative.  ____________________________________________   PHYSICAL EXAM:  VITAL SIGNS: ED Triage Vitals  Enc Vitals Group     BP 06/23/21 1258 (!) 143/104     Pulse Rate 06/23/21 1258 94     Resp 06/23/21 1258 17     Temp 06/23/21 1258 98.5 F (36.9 C)     Temp Source 06/23/21 1258 Oral     SpO2 06/23/21 1258 100 %   Constitutional: Alert and oriented. Well appearing and in no acute distress. Eyes: Conjunctivae are normal.  Head: Atraumatic. Nose: No congestion/rhinnorhea. Mouth/Throat: Mucous membranes are moist.  Oropharynx with mild erythema. No exudate.  Neck: No  stridor.  Cardiovascular: Normal rate, regular rhythm. Good peripheral circulation. Grossly normal heart sounds.   Respiratory: Normal respiratory effort.  No retractions. Lungs CTAB. No wheezing.  Gastrointestinal: No distention.  Musculoskeletal: No gross deformities of extremities. Neurologic:  Normal speech and language.  Skin:  Skin is warm, dry and intact. No rash noted.  ____________________________________________   LABS (all labs ordered are listed, but only abnormal results are displayed)  Labs Reviewed  RESP PANEL BY RT-PCR (FLU A&B, COVID) [id number]    ____________________________________________   PROCEDURES  Procedure(s) performed:   Procedures  None  ____________________________________________   INITIAL IMPRESSION / ASSESSMENT AND PLAN / ED COURSE  Pertinent labs & imaging results that were available during my care of the patient were reviewed by me and considered in my medical decision making (see chart for details).   This patient is Presenting for Evaluation of URI symptoms, which does require a range of treatment options, and is a complaint that involves a moderate risk of morbidity and mortality.  The Differential Diagnoses include CAP, asthma exacerbation, strep throat.  Medical Decision Making: Summary:  Patient presents emergency department with overall mild upper respiratory symptoms.  He has cough with sore throat and body aches.  COVID and flu PCR testing here is negative.  His oropharynx does not appear consistent with strep pharyngitis or peritonsillar abscess.  His lungs are clear without wheezing.  No increased work of breathing.  Do not see indication for chest x-ray or lab work at this time.  Plan for supportive care  at home.  Provided work note.    ____________________________________________  FINAL CLINICAL IMPRESSION(S) / ED DIAGNOSES  Final diagnoses:  Viral URI with cough     NEW OUTPATIENT MEDICATIONS STARTED DURING THIS  VISIT:  Discharge Medication List as of 06/23/2021  7:08 PM     START taking these medications   Details  albuterol (VENTOLIN HFA) 108 (90 Base) MCG/ACT inhaler Inhale 1-2 puffs into the lungs every 6 (six) hours as needed for wheezing or shortness of breath., Starting Tue 06/23/2021, Normal    benzonatate (TESSALON) 100 MG capsule Take 1 capsule (100 mg total) by mouth 3 (three) times daily as needed for cough., Starting Tue 06/23/2021, Normal    fluticasone (FLONASE) 50 MCG/ACT nasal spray Place 2 sprays into both nostrils daily for 7 days., Starting Tue 06/23/2021, Until Tue 06/30/2021, Normal        Note:  This document was prepared using Dragon voice recognition software and may include unintentional dictation errors.  Alona Bene, MD, [facility] Emergency Medicine    Elaine Middleton, Arlyss Repress, MD 06/23/21 1911

## 2021-06-23 NOTE — Discharge Instructions (Signed)

## 2021-07-23 ENCOUNTER — Encounter: Payer: Self-pay | Admitting: Internal Medicine

## 2021-07-23 DIAGNOSIS — E119 Type 2 diabetes mellitus without complications: Secondary | ICD-10-CM

## 2021-07-23 MED ORDER — ACCU-CHEK AVIVA PLUS VI STRP: ORAL_STRIP | 2 refills | Status: DC

## 2021-09-09 ENCOUNTER — Other Ambulatory Visit: Payer: Self-pay | Admitting: Student

## 2021-09-09 DIAGNOSIS — E1169 Type 2 diabetes mellitus with other specified complication: Secondary | ICD-10-CM

## 2021-09-16 ENCOUNTER — Other Ambulatory Visit: Payer: Self-pay | Admitting: Student

## 2021-09-16 DIAGNOSIS — I10 Essential (primary) hypertension: Secondary | ICD-10-CM

## 2021-10-15 NOTE — Progress Notes (Deleted)
   Office Visit   Patient ID: RONALD LONDO, male    DOB: July 27, 1970, 51 y.o.   MRN: [id number]   PCP: Elige Radon, MD   Subjective:   NIKESH TESCHNER is a 51 y.o. year old male with diabetes, hypertension, and hyperlipidemia who presents for follow up of the above chronic medical conditions.    Objective:   There were no vitals taken for this visit.  Assessment & Plan:   Problem List Items Addressed This Visit   None    No follow-ups on file.   Pt discussed with ***  Elige Radon, MD Internal Medicine Resident PGY-3 [facility] 10/15/2021 8:39 AM

## 2021-10-19 ENCOUNTER — Encounter: Payer: Managed Care, Other (non HMO) | Admitting: Internal Medicine

## 2021-10-19 DIAGNOSIS — E1169 Type 2 diabetes mellitus with other specified complication: Secondary | ICD-10-CM

## 2021-10-26 ENCOUNTER — Encounter: Payer: Managed Care, Other (non HMO) | Admitting: Internal Medicine

## 2021-10-28 ENCOUNTER — Encounter: Payer: Self-pay | Admitting: Internal Medicine

## 2021-12-14 ENCOUNTER — Encounter: Payer: Self-pay | Admitting: *Deleted

## 2022-01-26 ENCOUNTER — Other Ambulatory Visit: Payer: Self-pay

## 2022-01-26 ENCOUNTER — Telehealth: Payer: Self-pay | Admitting: *Deleted

## 2022-01-26 DIAGNOSIS — E119 Type 2 diabetes mellitus without complications: Secondary | ICD-10-CM

## 2022-01-26 NOTE — Telephone Encounter (Signed)
Call from [facility] . Patient's insurance will not cover the Accu Chek Avia but will cover the Accu Check Guide.  Patient will need new prescription for the Guide meter, Lancets and Strips.

## 2022-01-27 ENCOUNTER — Other Ambulatory Visit: Payer: Self-pay | Admitting: Student

## 2022-01-27 MED ORDER — ACCU-CHEK GUIDE ME W/DEVICE KIT: PACK | 0 refills | Status: DC

## 2022-01-27 MED ORDER — ACCU-CHEK SOFTCLIX LANCETS MISC
5 refills | Status: DC
Start: 2022-01-27 — End: 2023-09-05

## 2022-01-27 MED ORDER — GLUCOSE BLOOD VI STRP: ORAL_STRIP | 5 refills | Status: DC

## 2022-01-27 NOTE — Telephone Encounter (Signed)
I have ordered him the Accu Chek Guide meter, lancets and strips

## 2022-02-04 ENCOUNTER — Other Ambulatory Visit: Payer: Self-pay

## 2022-02-04 ENCOUNTER — Encounter (HOSPITAL_COMMUNITY): Payer: Self-pay | Admitting: Emergency Medicine

## 2022-02-04 ENCOUNTER — Emergency Department (HOSPITAL_COMMUNITY)
Admission: EM | Admit: 2022-02-04 | Discharge: 2022-02-05 | Disposition: A | Discharge status: Home / Self Care | Payer: Managed Care, Other (non HMO) | Attending: Emergency Medicine | Admitting: Emergency Medicine

## 2022-02-04 DIAGNOSIS — Z7984 Long term (current) use of oral hypoglycemic drugs: Secondary | ICD-10-CM | POA: Diagnosis not present

## 2022-02-04 DIAGNOSIS — E1165 Type 2 diabetes mellitus with hyperglycemia: Secondary | ICD-10-CM | POA: Diagnosis present

## 2022-02-04 DIAGNOSIS — R739 Hyperglycemia, unspecified: Secondary | ICD-10-CM

## 2022-02-04 DIAGNOSIS — Z79899 Other long term (current) drug therapy: Secondary | ICD-10-CM | POA: Insufficient documentation

## 2022-02-04 LAB — BASIC METABOLIC PANEL
Anion gap: 13 (ref 5–15)
BUN: 14 mg/dL (ref 6–20)
CO2: 24 mmol/L (ref 22–32)
Calcium: 9.5 mg/dL (ref 8.9–10.3)
Chloride: 97 mmol/L — ABNORMAL LOW (ref 98–111)
Creatinine, Ser: 1.23 mg/dL (ref 0.61–1.24)
GFR, Estimated: >60 mL/min (ref >60)
Glucose, Bld: 527 mg/dL (ref 70–99)
Potassium: 3.9 mmol/L (ref 3.5–5.1)
Sodium: 134 mmol/L — ABNORMAL LOW (ref 135–145)

## 2022-02-04 LAB — CBC
HCT: 44.5 % (ref 39.0–52.0)
Hemoglobin: 15.8 g/dL (ref 13.0–17.0)
MCH: 32.4 pg (ref 26.0–34.0)
MCHC: 35.5 g/dL (ref 30.0–36.0)
MCV: 91.2 fL (ref 80.0–100.0)
Platelets: 232 10*3/uL (ref 150–400)
RBC: 4.88 MIL/uL (ref 4.22–5.81)
RDW: 12.1 % (ref 11.5–15.5)
WBC: 7.4 10*3/uL (ref 4.0–10.5)
nRBC: 0 % (ref 0.0–0.2)

## 2022-02-04 LAB — URINALYSIS, ROUTINE W REFLEX MICROSCOPIC
Bacteria, UA: NONE SEEN
Bilirubin Urine: NEGATIVE
Glucose, UA: >=500 mg/dL — AB
Hgb urine dipstick: NEGATIVE
Ketones, ur: 5 mg/dL — AB
Leukocytes,Ua: NEGATIVE
Nitrite: NEGATIVE
Protein, ur: NEGATIVE mg/dL
Specific Gravity, Urine: 1.029 (ref 1.005–1.030)
pH: 6 (ref 5.0–8.0)

## 2022-02-04 LAB — BETA-HYDROXYBUTYRIC ACID: Beta-Hydroxybutyric Acid: 1.14 mmol/L — ABNORMAL HIGH (ref 0.05–0.27)

## 2022-02-04 LAB — CBG MONITORING, ED: Glucose-Capillary: 470 mg/dL — ABNORMAL HIGH (ref 70–99)

## 2022-02-04 NOTE — ED Triage Notes (Signed)
Patient from home w/ hyperglycemia when he checked his sugar after work today at 5 and it was 500. Patient states he had been feeling dizzy, urinary frequency and this prompted him to check his sugar. Per the patient he just got a new job and was waiting for his insurance to kick in so he hasnt been able to regularly check his sugars because he didn't have a glucometer. Pt denies chest pain, SHOB, abdominal pain, n/v/d.

## 2022-02-04 NOTE — ED Provider Triage Note (Signed)
Emergency Medicine Provider Triage Evaluation Note  Joshua Joshua [name] , a 51 y.o. male  was evaluated in triage.  Pt complains of elevated blood sugar readings in the past 2 days around 500.  Patient ports he had a new meter.  He reports compliancy with his medication.  He reports that he has been urinating more often, dry mouth, and feeling more fatigued for the past 3 to 4 weeks..  Review of Systems  Positive:  Negative:  Physical Exam  BP 126/89 (BP Location: Right Arm)   Pulse (!) 109   Temp 99.2 F (37.3 C) (Oral)   Resp 14   SpO2 97%  Gen:   Awake, Joshua [name] distress   Resp:  Normal effort  MSK:   Moves extremities without difficulty  Other:    Medical Decision Making  Medically screening exam initiated at 7:05 PM.  Appropriate orders placed.  Joshua Joshua [name] was informed that the remainder of the evaluation will be completed by another provider, this initial triage assessment does not replace that evaluation, and the importance of remaining in the ED until their evaluation is complete.  Labs ordered   Joshua Joshua [name], [place] 02/04/22 1910

## 2022-02-05 LAB — CBG MONITORING, ED
Glucose-Capillary: 347 mg/dL — ABNORMAL HIGH (ref 70–99)
Glucose-Capillary: 325 mg/dL — ABNORMAL HIGH (ref 70–99)
Glucose-Capillary: 269 mg/dL — ABNORMAL HIGH (ref 70–99)

## 2022-02-05 MED ORDER — INSULIN ASPART 100 UNIT/ML IJ SOLN
12.0000 [IU] | Freq: Once | INTRAMUSCULAR | Status: AC
  Administered 2022-02-05: 12 [IU] via SUBCUTANEOUS

## 2022-02-05 MED ORDER — LACTATED RINGERS IV BOLUS
1000.0000 mL | Freq: Once | INTRAVENOUS | Status: AC
  Administered 2022-02-05: 1000 mL via INTRAVENOUS

## 2022-02-05 NOTE — ED Provider Notes (Signed)
[facility] EMERGENCY DEPARTMENT Provider Note   CSN: [id number] Arrival date & time: 02/04/22  1834     History  Chief Complaint  Patient presents with   Hyperglycemia    Joshua [name] is a 51 y.o. male.  Patient presents to the emergency department for evaluation of elevated blood sugar.  Patient reports prior history of diabetes.  He has been taking his medications but has been unable to check his sugars lately.  He has been experiencing dizziness, blurred vision, increased thirst recently.  He was able to get a blood glucose monitor today, checked his sugar and it was elevated.  He has not had any recent illness.       Home Medications Prior to Admission medications   Medication Sig Start Date End Date Taking? Authorizing Provider  amLODipine (NORVASC) 10 MG tablet TAKE 1 TABLET BY MOUTH EVERY DAY Patient taking differently: Take 10 mg by mouth daily. 09/16/21  Yes Christian, Rylee, MD  atorvastatin (LIPITOR) 40 MG tablet Take 1 tablet (40 mg total) by mouth daily. 09/09/21  Yes Christian, Rylee, MD  metFORMIN (GLUCOPHAGE) 500 MG tablet Take 1 tablet (500 mg total) by mouth daily. 01/28/21  Yes Gaylan Gerold, DO  Multiple Vitamins-Minerals (MULTIVITAL PO) Take 1 tablet by mouth daily.   Yes [provider]  Accu-Chek Softclix Lancets lancets USE TO TEST BLOOD SUGAR UP TO FOUR TIMES DAILY AS DIRECTED 01/27/22   Lacinda Axon, MD  albuterol (VENTOLIN HFA) 108 (90 Base) MCG/ACT inhaler Inhale 1-2 puffs into the lungs every 6 (six) hours as needed for wheezing or shortness of breath. Patient not taking: Reported on 02/05/2022 06/23/21   Long, Wonda Olds, MD  benzonatate (TESSALON) 100 MG capsule Take 1 capsule (100 mg total) by mouth 3 (three) times daily as needed for cough. Patient not taking: Reported on 02/05/2022 06/23/21   Long, Wonda Olds, MD  Blood Glucose Monitoring Suppl (ACCU-CHEK GUIDE ME) w/Device KIT Use to check blood sugar at least once daily  01/27/22   Lacinda Axon, MD  fluticasone [facility]) 50 MCG/ACT nasal spray Place 2 sprays into both nostrils daily for 7 days. Patient not taking: Reported on 02/05/2022 06/23/21 06/30/21  Margette Fast, MD  glucose blood test strip Use as instructed 01/27/22   Lacinda Axon, MD  omeprazole (PRILOSEC) 20 MG capsule Take 1 capsule (20 mg total) by mouth daily. Patient not taking: Reported on 02/05/2022 03/01/19 02/29/20  Mitzi Hansen, MD      Allergies    Patient has no known allergies.    Review of Systems   Review of Systems  Physical Exam Updated Vital Signs BP 102/75   Pulse (!) 58   Temp 98.1 F (36.7 C) (Oral)   Resp 16   SpO2 99%  Physical Exam Vitals and nursing note reviewed.  Constitutional:      General: He is not in acute distress.    Appearance: He is well-developed.  HENT:     Head: Normocephalic and atraumatic.     Mouth/Throat:     Mouth: Mucous membranes are moist.  Eyes:     General: Vision grossly intact. Gaze aligned appropriately.     Extraocular Movements: Extraocular movements intact.     Conjunctiva/sclera: Conjunctivae normal.  Cardiovascular:     Rate and Rhythm: Normal rate and regular rhythm.     Pulses: Normal pulses.     Heart sounds: Normal heart sounds, S1 normal and S2 normal. No murmur heard.  No friction rub. No gallop.  Pulmonary:     Effort: Pulmonary effort is normal. No respiratory distress.     Breath sounds: Normal breath sounds.  Abdominal:     Palpations: Abdomen is soft.     Tenderness: There is no abdominal tenderness. There is no guarding or rebound.     Hernia: No hernia is present.  Musculoskeletal:        General: No swelling.     Cervical back: Full passive range of motion without pain, normal range of motion and neck supple. No pain with movement, spinous process tenderness or muscular tenderness. Normal range of motion.     Right lower leg: No edema.     Left lower leg: No edema.  Skin:    General: Skin is  warm and dry.     Capillary Refill: Capillary refill takes less than 2 seconds.     Findings: No ecchymosis, erythema, lesion or wound.  Neurological:     Mental Status: He is alert and oriented to person, place, and time.     GCS: GCS eye subscore is 4. GCS verbal subscore is 5. GCS motor subscore is 6.     Cranial Nerves: Cranial nerves 2-12 are intact.     Sensory: Sensation is intact.     Motor: Motor function is intact. No weakness or abnormal muscle tone.     Coordination: Coordination is intact.  Psychiatric:        Mood and Affect: Mood normal.        Speech: Speech normal.        Behavior: Behavior normal.     ED Results / Procedures / Treatments   Labs (all labs ordered are listed, but only abnormal results are displayed) Labs Reviewed  BASIC METABOLIC PANEL - Abnormal; Notable for the following components:      Result Value   Sodium 134 (*)    Chloride 97 (*)    Glucose, Bld 527 (*)    All other components within normal limits  URINALYSIS, ROUTINE W REFLEX MICROSCOPIC - Abnormal; Notable for the following components:   Color, Urine STRAW (*)    Glucose, UA >=500 (*)    Ketones, ur 5 (*)    All other components within normal limits  BETA-HYDROXYBUTYRIC ACID - Abnormal; Notable for the following components:   Beta-Hydroxybutyric Acid 1.14 (*)    All other components within normal limits  CBG MONITORING, ED - Abnormal; Notable for the following components:   Glucose-Capillary 470 (*)    All other components within normal limits  CBG MONITORING, ED - Abnormal; Notable for the following components:   Glucose-Capillary 347 (*)    All other components within normal limits  CBG MONITORING, ED - Abnormal; Notable for the following components:   Glucose-Capillary 325 (*)    All other components within normal limits  CBG MONITORING, ED - Abnormal; Notable for the following components:   Glucose-Capillary 269 (*)    All other components within normal limits  CBC  I-STAT  VENOUS BLOOD GAS, ED    EKG None  Radiology No results found.  Procedures Procedures    Medications Ordered in ED Medications  lactated ringers bolus 1,000 mL (0 mLs Intravenous Stopped 02/05/22 0514)    Followed by  lactated ringers bolus 1,000 mL (0 mLs Intravenous Stopped 02/05/22 0514)  insulin aspart (novoLOG) injection 12 Units (12 Units Subcutaneous Given 02/05/22 0553)    ED Course/ Medical Decision Making/ A&P  Medical Decision Making Amount and/or Complexity of Data Reviewed Labs: ordered.  Risk Prescription drug management.   Presents to the emergency department for evaluation of elevated blood sugars.  Patient does have a history of diabetes.  He did not have a glucometer for some time, had not been checking his sugars.  He did have symptoms of hyperglycemia with blurred vision, increased thirst, dizziness.  He finally got a glucometer and checked his sugar, found it to be elevated.  Patient found to have hyperglycemia without anion gap acidosis.  No obvious precipitating features for the hyperglycemia.  He has not recently been ill.  He appears well.  Patient's vital signs are unremarkable.  Patient treated with a combination of IV fluid hydration and insulin.  Sugar is improved.  He will be appropriate for discharge, continue current regimen and follow-up with PCP.        Final Clinical Impression(s) / ED Diagnoses Final diagnoses:  Hyperglycemia    Rx / DC Orders ED Discharge Orders     None         Drystan Reader, Gwenyth Allegra, MD 02/05/22 0710

## 2022-02-08 NOTE — Progress Notes (Unsigned)
   CC: ED Follow-up (Hyperglycemia)  HPI:   Mr.Jasn E Alcorta is a 51 y.o. male with a past medical history of diabetes, anxiety, and hypertension who presents for ED follow-up. He visited the ED on 8-18 and found to have hyperglycemia with serum glucose >500.     Past Medical History:  Diagnosis Date   Anxiety    takes Hydroxyzine   Depression    has been on Effexor, not taking at this time   Diabetes mellitus without complication (HCC) 07/2016     Review of Systems:    Reports occasional dizziness, lightheadedness Denies abdominal pain, polyuria, polydipsia, fever, chills, nausea, vomiting   Physical Exam:  Vitals:   02/10/22 0840  BP: (!) 141/85  Pulse: 73  SpO2: 99%  Weight: 157 lb (71.2 kg)    General:   awake and alert, sitting comfortably in chair, cooperative, not in acute distress Skin:   warm and dry, intact without any obvious lesions or scars, no rashes or lesions  Lungs:   normal respiratory effort, breathing unlabored, symmetrical chest rise, no crackles or wheezing Cardiac:   regular rate and rhythm, normal S1 and S2 Psychiatric:   mood and affect normal, intelligible speech    Assessment & Plan:   HTN (hypertension) Patient has been taking amlodipine 10 mg daily.  His blood pressure today in clinic was 141/85.  This is elevated above goal of 130/80, but controlling his diabetes is the more urgent issue.  -Encourage investment in a blood pressure cuff and taking blood pressure measurements at home -Continue amlodipine 10 mg daily and return to clinic in about 3 months for repeat blood pressure measurements   Type 2 diabetes mellitus with hyperlipidemia [facility]) Patient visited the ED on February 05, 2022 where he was found to have hyperglycemia with serum glucose above 500.  At that time, he was reporting polydipsia and polyuria.  Administered fluids and insulin, and discharged in good condition.  He has been prescribed metformin 500 mg twice per day,  but only takes half a tablet twice per day, totaling 500mg  daily.  He is reluctant to take any more than that due to severe nausea.  About 3 years ago, he took NovoLog but discontinued because his glycemic control is good.  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  In clinic, his capillary glucose was 460 and his A1c was above 14.0, which is much higher than his A1c of 6.6 one year ago.  He reports a little dizziness but denies polyuria, polydipsia, and abdominal pain.  He attributes his high glucose A1c levels to unhealthy dietary changes over the last several months.  Of note, he also started smoking 1/2 pack/day about 2 to 3 months ago.  -Refill metformin and continue to take 500 mg daily -Prescribe empagliflozin 10 mg daily follow-up in 3 months for recheck of A1c   Hyperlipidemia associated with type 2 diabetes mellitus (HCC) Patient's last lipid panel one year ago in August 2022 revealed total cholesterol 221, HDL 36, LDL 152.  He has been taking atorvastatin 40 mg daily.  -Order lipid panel to reevaluate cholesterol levels -Continue to take atorvastatin 40 mg daily     See Encounters Tab for problem based charting.  Patient seen with Dr. September 2022

## 2022-02-10 ENCOUNTER — Ambulatory Visit (INDEPENDENT_AMBULATORY_CARE_PROVIDER_SITE_OTHER): Payer: Managed Care, Other (non HMO) | Admitting: Student

## 2022-02-10 ENCOUNTER — Encounter: Payer: Self-pay | Admitting: Student

## 2022-02-10 VITALS — BP 141/85 | HR 73 | Wt 157.0 lb

## 2022-02-10 DIAGNOSIS — F17210 Nicotine dependence, cigarettes, uncomplicated: Secondary | ICD-10-CM | POA: Diagnosis not present

## 2022-02-10 DIAGNOSIS — E1169 Type 2 diabetes mellitus with other specified complication: Secondary | ICD-10-CM | POA: Diagnosis not present

## 2022-02-10 DIAGNOSIS — Z794 Long term (current) use of insulin: Secondary | ICD-10-CM | POA: Diagnosis not present

## 2022-02-10 DIAGNOSIS — E785 Hyperlipidemia, unspecified: Secondary | ICD-10-CM

## 2022-02-10 DIAGNOSIS — E119 Type 2 diabetes mellitus without complications: Secondary | ICD-10-CM

## 2022-02-10 DIAGNOSIS — I10 Essential (primary) hypertension: Secondary | ICD-10-CM

## 2022-02-10 LAB — POCT GLYCOSYLATED HEMOGLOBIN (HGB A1C): HbA1c POC (<> result, manual entry): >14 % — AB (ref 4.0–5.6)

## 2022-02-10 LAB — GLUCOSE, CAPILLARY: Glucose-Capillary: 468 mg/dL — ABNORMAL HIGH (ref 70–99)

## 2022-02-10 MED ORDER — ATORVASTATIN CALCIUM 40 MG PO TABS: 40.0000 mg | ORAL_TABLET | Freq: Every day | ORAL | 3 refills | Status: DC

## 2022-02-10 MED ORDER — AMLODIPINE BESYLATE 10 MG PO TABS: 10.0000 mg | ORAL_TABLET | Freq: Every day | ORAL | 1 refills | Status: DC

## 2022-02-10 MED ORDER — EMPAGLIFLOZIN 10 MG PO TABS: 10.0000 mg | ORAL_TABLET | Freq: Every day | ORAL | 2 refills | Status: DC

## 2022-02-10 MED ORDER — METFORMIN HCL 500 MG PO TABS: 500.0000 mg | ORAL_TABLET | Freq: Every day | ORAL | 1 refills | Status: DC

## 2022-02-10 NOTE — Patient Instructions (Signed)
  Thank you, JoshuaJoshua [name], for allowing [place] to provide your care today. Today we discussed . . .  > Diabetes       - your blood sugars are very high and your A1C today (>14) is also very high       - we have prescribed empagliflozin, which is another medication for diabetes       - please take this medication everyday > Hyperlipidemia       - we are checking your lipids today, please continue to take your statin > Hypertension       - continue to take your amlodipine and we will check your blood pressure again at your next visit   I have ordered the following labs for you:   Lab Orders         Glucose, capillary         Lipid Profile         POC Hbg A1C       Tests ordered today:  None   Referrals ordered today:   Referral Orders  No referral(s) requested today      I have ordered the following medication/changed the following medications:   Stop the following medications: Medications Discontinued During This Encounter  Medication Reason   metFORMIN (GLUCOPHAGE) 500 MG tablet Reorder   atorvastatin (LIPITOR) 40 MG tablet Reorder   amLODipine (NORVASC) 10 MG tablet Reorder     Start the following medications: Meds ordered this encounter  Medications   amLODipine (NORVASC) 10 MG tablet    Sig: Take 1 tablet (10 mg total) by mouth daily.    Dispense:  90 tablet    Refill:  1   atorvastatin (LIPITOR) 40 MG tablet    Sig: Take 1 tablet (40 mg total) by mouth daily.    Dispense:  90 tablet    Refill:  3   empagliflozin (JARDIANCE) 10 MG TABS tablet    Sig: Take 1 tablet (10 mg total) by mouth daily before breakfast.    Dispense:  30 tablet    Refill:  2   metFORMIN (GLUCOPHAGE) 500 MG tablet    Sig: Take 1 tablet (500 mg total) by mouth daily.    Dispense:  180 tablet    Refill:  1      Follow up: 3 months    Remember:  Please continue to take your medications and start taking empagliflozin for your diabetes. We will see you back in clinic in about  3 months!   Should you have any questions or concerns please call the internal medicine clinic at [phone].     Lajuana Ripple, MD [facility]

## 2022-02-10 NOTE — Assessment & Plan Note (Signed)
Patient has been taking amlodipine 10 mg daily.  His blood pressure today in clinic was 141/85.  This is elevated above goal of 130/80, but controlling his diabetes is the more urgent issue.  -Encourage investment in a blood pressure cuff and taking blood pressure measurements at home -Continue amlodipine 10 mg daily and return to clinic in about 3 months for repeat blood pressure measurements

## 2022-02-10 NOTE — Assessment & Plan Note (Signed)
Patient visited the ED on February 05, 2022 where he was found to have hyperglycemia with serum glucose above 500.  At that time, he was reporting polydipsia and polyuria.  Administered fluids and insulin, and discharged in good condition.  He has been prescribed metformin 500 mg twice per day, but only takes half a tablet twice per day, totaling 500mg  daily.  He is reluctant to take any more than that due to severe nausea.  About 3 years ago, he took NovoLog but discontinued because his glycemic control is good.  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  In clinic, his capillary glucose was 460 and his A1c was above 14.0, which is much higher than his A1c of 6.6 one year ago.  He reports a little dizziness but denies polyuria, polydipsia, and abdominal pain.  He attributes his high glucose A1c levels to unhealthy dietary changes over the last several months.  Of note, he also started smoking 1/2 pack/day about 2 to 3 months ago.  -Refill metformin and continue to take 500 mg daily -Prescribe empagliflozin 10 mg daily follow-up in 3 months for recheck of A1c

## 2022-02-10 NOTE — Assessment & Plan Note (Signed)
Patient's last lipid panel one year ago in August 2022 revealed total cholesterol 221, HDL 36, LDL 152.  He has been taking atorvastatin 40 mg daily.  -Order lipid panel to reevaluate cholesterol levels -Continue to take atorvastatin 40 mg daily

## 2022-02-11 ENCOUNTER — Encounter: Payer: Self-pay | Admitting: Student

## 2022-02-11 ENCOUNTER — Telehealth: Payer: Self-pay

## 2022-02-11 LAB — LIPID PANEL
Chol/HDL Ratio: 5.3 ratio — ABNORMAL HIGH (ref 0.0–5.0)
Cholesterol, Total: 160 mg/dL (ref 100–199)
HDL: 30 mg/dL — ABNORMAL LOW (ref >39)
LDL Chol Calc (NIH): 96 mg/dL (ref 0–99)
Triglycerides: 197 mg/dL — ABNORMAL HIGH (ref 0–149)
VLDL Cholesterol Cal: 34 mg/dL (ref 5–40)

## 2022-02-11 NOTE — Telephone Encounter (Signed)
PA came through on cover my meds for patient Joshua [name]) was submitted awaiting approval or denial

## 2022-02-11 NOTE — Progress Notes (Signed)
Internal Medicine Clinic Attending  I saw and evaluated the patient.  I personally confirmed the key portions of the history and exam documented by Dr. Clearance Coots and I reviewed pertinent patient test results.  The assessment, diagnosis, and plan were formulated together and I agree with the documentation in the resident's note.   Previously well controlled diabetes, returns after one year without followup and diabetes is now uncontrolled. He also has unintended weightloss, so I wonder if he is becoming insulin deficient, but no episodes DKA at this point. He prefers to avoid insulin for now, which seems fine, thought I suspect we may need to use insulin at some point in the near future. He is on maximally tolerated dose of metformin, will add SGLT2 inhibitor and we talked about nutrition changes. At next visit, I hope to see improvement in A1c, and we can discuss adding long acting insulin, GLP1 or combination of those.

## 2022-02-12 ENCOUNTER — Encounter: Payer: Self-pay | Admitting: Student

## 2022-02-15 ENCOUNTER — Telehealth: Payer: Self-pay | Admitting: *Deleted

## 2022-02-15 NOTE — Telephone Encounter (Signed)
Call to [facility] for PA for Jardiance .  Provided information PA was approved 02/15/2022 thru 02/16/2023.  [facility] was called and notified of.   Call to patient message was left that the Jardiance has been approved.

## 2022-02-15 NOTE — Telephone Encounter (Signed)
Pt reporting his blood sugars have been 345 without eating and needs to know what to do.  Pt also needs to know what to do until he is able to get his medication and his sugars down.  Pt is concerned about work and what to do.

## 2022-02-15 NOTE — Telephone Encounter (Signed)
RTC to patient -message left that the Clinics had called. 

## 2022-02-15 NOTE — Telephone Encounter (Signed)
Refill-Pt calling back to f/u on his PA for the following medication.     empagliflozin (JARDIANCE) 10 MG TABS tablet  [id number] - [place], [place] - [address] AT [facility]

## 2022-02-17 ENCOUNTER — Telehealth: Payer: Self-pay

## 2022-02-17 NOTE — Telephone Encounter (Signed)
Patient Name:Joshua [name],Joshua [name] Patient DOB:May 27, 1971 Patient BX:[id number] Request GB:[id number] Medication Name:Jardiance 10mg  tablet Prescriber Name:Daniel MD Reviewed [facility]ance [facility]  I am pleased to inform you that I have approved your request for this medication. This authorization is effective from 02/15/2022 through 02/16/2023.  If your patient has any questions about pharmacy benefits covergage, please have your patient call the customer service number on their [employer] ID card.

## 2022-05-04 ENCOUNTER — Encounter: Payer: Self-pay | Admitting: Student

## 2022-05-12 ENCOUNTER — Other Ambulatory Visit: Payer: Self-pay | Admitting: Student

## 2022-05-12 DIAGNOSIS — E119 Type 2 diabetes mellitus without complications: Secondary | ICD-10-CM

## 2023-04-02 ENCOUNTER — Ambulatory Visit
Admission: EM | Admit: 2023-04-02 | Discharge: 2023-04-02 | Disposition: A | Discharge status: Home / Self Care | Payer: 59

## 2023-04-02 DIAGNOSIS — E1169 Type 2 diabetes mellitus with other specified complication: Secondary | ICD-10-CM

## 2023-04-02 NOTE — ED Triage Notes (Signed)
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 Last saw his PCP 6-7 months ago. Last A1c was last year

## 2023-04-02 NOTE — ED Provider Notes (Signed)
Patient presented for HBA1C for DOT physical. Recommended further evaluation by PCP for continuity of care. Patient does admit that recent HBA1C was around 7-8. CBG this morning was 154.    Tomi Bamberger, PA-C 04/02/23 1055

## 2023-09-03 ENCOUNTER — Emergency Department (HOSPITAL_COMMUNITY)
Admission: EM | Admit: 2023-09-03 | Discharge: 2023-09-04 | Disposition: A | Discharge status: Home / Self Care | Attending: Emergency Medicine | Admitting: Emergency Medicine

## 2023-09-03 ENCOUNTER — Other Ambulatory Visit: Payer: Self-pay

## 2023-09-03 ENCOUNTER — Encounter (HOSPITAL_COMMUNITY): Payer: Self-pay | Admitting: *Deleted

## 2023-09-03 DIAGNOSIS — E1165 Type 2 diabetes mellitus with hyperglycemia: Secondary | ICD-10-CM | POA: Diagnosis not present

## 2023-09-03 DIAGNOSIS — Z79899 Other long term (current) drug therapy: Secondary | ICD-10-CM | POA: Diagnosis not present

## 2023-09-03 DIAGNOSIS — Z7984 Long term (current) use of oral hypoglycemic drugs: Secondary | ICD-10-CM | POA: Insufficient documentation

## 2023-09-03 DIAGNOSIS — I10 Essential (primary) hypertension: Secondary | ICD-10-CM | POA: Diagnosis not present

## 2023-09-03 DIAGNOSIS — R739 Hyperglycemia, unspecified: Secondary | ICD-10-CM

## 2023-09-03 DIAGNOSIS — R631 Polydipsia: Secondary | ICD-10-CM | POA: Diagnosis present

## 2023-09-03 LAB — COMPREHENSIVE METABOLIC PANEL
ALT: 27 U/L (ref 0–44)
AST: 20 U/L (ref 15–41)
Albumin: 4.4 g/dL (ref 3.5–5.0)
Alkaline Phosphatase: 140 U/L — ABNORMAL HIGH (ref 38–126)
Anion gap: 12 (ref 5–15)
BUN: 15 mg/dL (ref 6–20)
CO2: 24 mmol/L (ref 22–32)
Calcium: 10.1 mg/dL (ref 8.9–10.3)
Chloride: 96 mmol/L — ABNORMAL LOW (ref 98–111)
Creatinine, Ser: 1.04 mg/dL (ref 0.61–1.24)
GFR, Estimated: >60 mL/min (ref >60)
Glucose, Bld: 452 mg/dL — ABNORMAL HIGH (ref 70–99)
Potassium: 3.8 mmol/L (ref 3.5–5.1)
Sodium: 132 mmol/L — ABNORMAL LOW (ref 135–145)
Total Bilirubin: 1.1 mg/dL (ref 0.0–1.2)
Total Protein: 7.8 g/dL (ref 6.5–8.1)

## 2023-09-03 LAB — URINALYSIS, ROUTINE W REFLEX MICROSCOPIC
Bacteria, UA: NONE SEEN
Bilirubin Urine: NEGATIVE
Glucose, UA: >=500 mg/dL — AB
Hgb urine dipstick: NEGATIVE
Ketones, ur: NEGATIVE mg/dL
Leukocytes,Ua: NEGATIVE
Nitrite: NEGATIVE
Protein, ur: NEGATIVE mg/dL
Specific Gravity, Urine: 1.023 (ref 1.005–1.030)
pH: 5 (ref 5.0–8.0)

## 2023-09-03 LAB — CBC
HCT: 48.5 % (ref 39.0–52.0)
Hemoglobin: 16.9 g/dL (ref 13.0–17.0)
MCH: 30.8 pg (ref 26.0–34.0)
MCHC: 34.8 g/dL (ref 30.0–36.0)
MCV: 88.3 fL (ref 80.0–100.0)
Platelets: 263 10*3/uL (ref 150–400)
RBC: 5.49 MIL/uL (ref 4.22–5.81)
RDW: 12.2 % (ref 11.5–15.5)
WBC: 9.8 10*3/uL (ref 4.0–10.5)
nRBC: 0 % (ref 0.0–0.2)

## 2023-09-03 LAB — LIPASE, BLOOD: Lipase: 39 U/L (ref 11–51)

## 2023-09-03 LAB — CBG MONITORING, ED: Glucose-Capillary: 460 mg/dL — ABNORMAL HIGH (ref 70–99)

## 2023-09-03 NOTE — ED Triage Notes (Signed)
 High blood sugar for 2 weeks  he has been missing his meds and eating more sweet food    chest pain at present

## 2023-09-04 ENCOUNTER — Emergency Department (HOSPITAL_COMMUNITY)

## 2023-09-04 LAB — I-STAT VENOUS BLOOD GAS, ED
Acid-Base Excess: 1 mmol/L (ref 0.0–2.0)
Bicarbonate: 27.2 mmol/L (ref 20.0–28.0)
Calcium, Ion: 1.21 mmol/L (ref 1.15–1.40)
HCT: 48 % (ref 39.0–52.0)
Hemoglobin: 16.3 g/dL (ref 13.0–17.0)
O2 Saturation: 70 %
Potassium: 4 mmol/L (ref 3.5–5.1)
Sodium: 135 mmol/L (ref 135–145)
TCO2: 29 mmol/L (ref 22–32)
pCO2, Ven: 45.8 mmHg (ref 44–60)
pH, Ven: 7.382 (ref 7.25–7.43)
pO2, Ven: 37 mmHg (ref 32–45)

## 2023-09-04 LAB — CBG MONITORING, ED
Glucose-Capillary: 388 mg/dL — ABNORMAL HIGH (ref 70–99)
Glucose-Capillary: 308 mg/dL — ABNORMAL HIGH (ref 70–99)

## 2023-09-04 LAB — TROPONIN I (HIGH SENSITIVITY): Troponin I (High Sensitivity): 4 ng/L (ref <18)

## 2023-09-04 MED ORDER — SODIUM CHLORIDE 0.9 % IV BOLUS
1000.0000 mL | Freq: Once | INTRAVENOUS | Status: AC
  Administered 2023-09-04: 1000 mL via INTRAVENOUS

## 2023-09-04 MED ORDER — EMPAGLIFLOZIN 10 MG PO TABS: 10.0000 mg | ORAL_TABLET | Freq: Every day | ORAL | 1 refills | Status: DC

## 2023-09-04 MED ORDER — INSULIN ASPART 100 UNIT/ML IJ SOLN
8.0000 [IU] | Freq: Once | INTRAMUSCULAR | Status: AC
  Administered 2023-09-04: 8 [IU] via SUBCUTANEOUS

## 2023-09-04 NOTE — ED Provider Notes (Signed)
 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  Has been out of his Jardiance for a few weeks due to inability to follow-up in the outpatient setting with his PCP due to his work schedule.  Denies chest pain shortness breath palpitation confusion presyncopal symptoms or fever.  HPI     Home Medications Prior to Admission medications   Medication Sig Start Date End Date Taking? Authorizing Provider  empagliflozin (JARDIANCE) 10 MG TABS tablet Take 1 tablet (10 mg total) by mouth daily before breakfast. 09/04/23  Yes Zuri Lascala R, PA-C  Accu-Chek Softclix Lancets lancets USE TO TEST BLOOD SUGAR UP TO FOUR TIMES DAILY AS DIRECTED 01/27/22   Steffanie Rainwater, MD  albuterol (VENTOLIN HFA) 108 (90 Base) MCG/ACT inhaler Inhale 1-2 puffs into the lungs every 6 (six) hours as needed for wheezing or shortness of breath. Patient not taking: Reported on 02/05/2022 06/23/21   Long, Arlyss Repress, MD  amLODipine (NORVASC) 10 MG tablet Take 1 tablet (10 mg total) by mouth daily. 02/10/22   Crissie Sickles, MD  atorvastatin (LIPITOR) 40 MG tablet Take 1 tablet (40 mg total) by mouth daily. 02/10/22   Crissie Sickles, MD  benzonatate (TESSALON) 100 MG capsule Take 1 capsule (100 mg total) by mouth 3 (three) times daily as needed for cough. Patient not taking: Reported on 02/05/2022 06/23/21   Long, Arlyss Repress, MD  Blood Glucose Monitoring Suppl (ACCU-CHEK GUIDE ME) w/Device KIT Use to check blood sugar at least once daily 01/27/22   Steffanie Rainwater, MD  fluticasone [facility]) 50 MCG/ACT nasal spray Place 2 sprays into  both nostrils daily for 7 days. Patient not taking: Reported on 02/05/2022 06/23/21 06/30/21  Maia Plan, MD  glucose blood test strip Use as instructed 01/27/22   Steffanie Rainwater, MD  metFORMIN (GLUCOPHAGE) 500 MG tablet Take 1 tablet (500 mg total) by mouth daily. 02/10/22   Crissie Sickles, MD  Multiple Vitamins-Minerals (MULTIVITAL PO) Take 1 tablet by mouth daily.    [provider]  omeprazole (PRILOSEC) 20 MG capsule Take 1 capsule (20 mg total) by mouth daily. Patient not taking: Reported on 02/05/2022 03/01/19 02/29/20  Elige Radon, MD      Allergies    Patient has no known allergies.    Review of Systems   Review of Systems  Constitutional: Negative.   Cardiovascular: Negative.   Gastrointestinal: Negative.   Endocrine: Positive for polydipsia and polyuria.    Physical Exam Updated Vital Signs BP 123/80   Pulse (!) 59   Temp 98.1 F (36.7 C)   Resp (!) 25   Ht 6' (1.829 m)   Wt 71.2 kg   SpO2 100%   BMI 21.29 kg/m  Physical Exam Vitals and nursing note reviewed.  Constitutional:      Appearance: He is not ill-appearing or toxic-appearing.  HENT:     Head: Normocephalic and atraumatic.     Mouth/Throat:     Mouth: Mucous membranes are moist.     Pharynx: No oropharyngeal exudate or posterior oropharyngeal erythema.  Eyes:  General:        Right eye: No discharge.        Left eye: No discharge.     Conjunctiva/sclera: Conjunctivae normal.  Cardiovascular:     Rate and Rhythm: Normal rate and regular rhythm.     Pulses: Normal pulses.     Heart sounds: Normal heart sounds.  Pulmonary:     Effort: Pulmonary effort is normal. No respiratory distress.     Breath sounds: Normal breath sounds. No wheezing or rales.  Abdominal:     General: There is no distension.     Palpations: Abdomen is soft.     Tenderness: There is no abdominal tenderness. There is no guarding or rebound.  Musculoskeletal:        General: No deformity.     Cervical back:  Neck supple.     Right lower leg: No edema.     Left lower leg: No edema.  Skin:    General: Skin is warm and dry.     Capillary Refill: Capillary refill takes less than 2 seconds.  Neurological:     General: No focal deficit present.     Mental Status: He is alert and oriented to person, place, and time. Mental status is at baseline.  Psychiatric:        Mood and Affect: Mood normal.     ED Results / Procedures / Treatments   Labs (all labs ordered are listed, but only abnormal results are displayed) Labs Reviewed  COMPREHENSIVE METABOLIC PANEL - Abnormal; Notable for the following components:      Result Value   Sodium 132 (*)    Chloride 96 (*)    Glucose, Bld 452 (*)    Alkaline Phosphatase 140 (*)    All other components within normal limits  URINALYSIS, ROUTINE W REFLEX MICROSCOPIC - Abnormal; Notable for the following components:   Color, Urine STRAW (*)    Glucose, UA >=500 (*)    All other components within normal limits  CBG MONITORING, ED - Abnormal; Notable for the following components:   Glucose-Capillary 460 (*)    All other components within normal limits  LIPASE, BLOOD  CBC  I-STAT VENOUS BLOOD GAS, ED  CBG MONITORING, ED  TROPONIN I (HIGH SENSITIVITY)    EKG None  Radiology DG Chest Portable 1 View Result Date: 09/04/2023 CLINICAL DATA:  Chest pain EXAM: PORTABLE CHEST 1 VIEW COMPARISON:  None Available. FINDINGS: The heart size and mediastinal contours are within normal limits. Both lungs are clear. The visualized skeletal structures are unremarkable. IMPRESSION: No active disease. Electronically Signed   By: Deatra Robinson M.D.   On: 09/04/2023 02:29    Procedures Procedures    Medications Ordered in ED Medications  sodium chloride 0.9 % bolus 1,000 mL (has no administration in time range)  insulin aspart (novoLOG) injection 8 Units (has no administration in time range)    ED Course/ Medical Decision Making/ A&P                                  Medical Decision Making 53 year old male presents with hyperglycemia polydipsia polyuria.   Normal on intake.  Cardiopulmonary dam is unremarkable, abdominal exam is benign.  Patient is well-appearing, no lower extremity edema.  Amount and/or Complexity of Data Reviewed Labs:     Details: CBC without leukocytosis, CMP with pseudohyponatremia of 132 however in context of hyperglycemia of 452.  No anion gap.  UA with greater than 500 glucose.  Lipase is normal. Troponin is 4, VBG unremarkable. Radiology: ordered.    Details:  Chest x-ray negative for acute cardiopulmonary disease,  Risk Prescription drug management.   No evidence of hyperglycemic crisis, CBG downtrending after fluids and subcu insulin, clinical concern for emergent underlying condition or morbidity recovered patient management is exceedingly low.  Patient was prescribed a refill of his Jardiance in the inpatient setting and encouraged follow-up with his PCP.  Lovelle voiced understanding of his medical evaluation and treatment plan. Each of their questions answered to their expressed satisfaction.  Return precautions were given.  Patient is well-appearing, stable, and was discharged in good condition.  This chart was dictated using voice recognition software, Dragon. Despite the best efforts of this provider to proofread and correct errors, errors may still occur which can change documentation meaning.         Final Clinical Impression(s) / ED Diagnoses Final diagnoses:  None    Rx / DC Orders ED Discharge Orders          Ordered    empagliflozin (JARDIANCE) 10 MG TABS tablet  Daily before breakfast        09/04/23 0305              Janeah Kovacich, Eugene Gavia, PA-C 09/04/23 0440    Gloris Manchester, MD 09/04/23 [phone]

## 2023-09-04 NOTE — Discharge Instructions (Signed)
 Please resume your jardiance as prescribed. Follow up in the outpatient setting with your PCP for reevaluation of your medications and ongoing prescriptions. Return to the ER with any new severe symptoms.

## 2023-09-05 ENCOUNTER — Ambulatory Visit: Admitting: Student

## 2023-09-05 ENCOUNTER — Other Ambulatory Visit: Payer: Self-pay | Admitting: Student

## 2023-09-05 ENCOUNTER — Telehealth: Payer: Self-pay

## 2023-09-05 VITALS — BP 126/87 | HR 79 | Temp 98.4°F | Ht 72.0 in | Wt 188.4 lb

## 2023-09-05 DIAGNOSIS — E785 Hyperlipidemia, unspecified: Secondary | ICD-10-CM | POA: Diagnosis not present

## 2023-09-05 DIAGNOSIS — I10 Essential (primary) hypertension: Secondary | ICD-10-CM | POA: Diagnosis not present

## 2023-09-05 DIAGNOSIS — Z1211 Encounter for screening for malignant neoplasm of colon: Secondary | ICD-10-CM | POA: Insufficient documentation

## 2023-09-05 DIAGNOSIS — E1169 Type 2 diabetes mellitus with other specified complication: Secondary | ICD-10-CM

## 2023-09-05 DIAGNOSIS — F17210 Nicotine dependence, cigarettes, uncomplicated: Secondary | ICD-10-CM

## 2023-09-05 DIAGNOSIS — E119 Type 2 diabetes mellitus without complications: Secondary | ICD-10-CM

## 2023-09-05 DIAGNOSIS — Z720 Tobacco use: Secondary | ICD-10-CM | POA: Insufficient documentation

## 2023-09-05 DIAGNOSIS — Z7984 Long term (current) use of oral hypoglycemic drugs: Secondary | ICD-10-CM

## 2023-09-05 LAB — POCT GLYCOSYLATED HEMOGLOBIN (HGB A1C): Hemoglobin A1C: 11.1 % — AB (ref 4.0–5.6)

## 2023-09-05 LAB — GLUCOSE, CAPILLARY: Glucose-Capillary: 378 mg/dL — ABNORMAL HIGH (ref 70–99)

## 2023-09-05 MED ORDER — ATORVASTATIN CALCIUM 40 MG PO TABS: 40.0000 mg | ORAL_TABLET | Freq: Every day | ORAL | 3 refills | Status: DC

## 2023-09-05 MED ORDER — GLUCOSE BLOOD VI STRP: ORAL_STRIP | 5 refills | Status: DC

## 2023-09-05 MED ORDER — EMPAGLIFLOZIN 10 MG PO TABS: 10.0000 mg | ORAL_TABLET | Freq: Every day | ORAL | 1 refills | Status: DC

## 2023-09-05 MED ORDER — ACCU-CHEK SOFTCLIX LANCETS MISC
5 refills | Status: AC
Start: 2023-09-05 — End: ?

## 2023-09-05 MED ORDER — ACCU-CHEK GUIDE ME W/DEVICE KIT: PACK | 0 refills | Status: AC

## 2023-09-05 MED ORDER — AMLODIPINE BESYLATE 10 MG PO TABS
10.0000 mg | ORAL_TABLET | Freq: Every day | ORAL | 1 refills | Status: DC
Start: 2023-09-05 — End: 2024-03-14

## 2023-09-05 MED ORDER — METFORMIN HCL 500 MG PO TABS: 500.0000 mg | ORAL_TABLET | Freq: Every day | ORAL | Status: DC

## 2023-09-05 NOTE — Assessment & Plan Note (Addendum)
 Due for lipid panel. Restart atorvastatin. ASCVD risk above 20%. Primary prevention goal LDL below 100. - Resume atorvastatin 40 qd

## 2023-09-05 NOTE — Telephone Encounter (Signed)
 Jardiance has been prescribed by Paris Lore, PA-C - NOT [facility] PROVIDER.

## 2023-09-05 NOTE — Assessment & Plan Note (Addendum)
 Visited the ED yesterday due to high blood glucose reading. He also noted increased urination but no other symptoms and not in DKA. He was out of all medicines x 2 months as his busy work schedule prevented office checkup and he had no refills. Today he feels well and without complaints. - A1c today is 11 after being off medicine x 2 months, actually an improvement from 7 months ago when he was above 14. This suggests that we can resume his prior regimen for now and further titrate as needed in 3 months. - Resume metformin 500 every day (limited due to GI upset) - Resume Jardiance 10 every day - ACR and foot exam today (foot exam stable) and eye ref - Eval in 3 months, could increase jardiance or add therapy

## 2023-09-05 NOTE — Assessment & Plan Note (Signed)
 Blood pressure on recheck 126/87. Would prefer diastolic below 80 given diabetes. As he is restarting medicines, will not adjust regimen. - Continue amlodipine 10 qd

## 2023-09-05 NOTE — Assessment & Plan Note (Signed)
 Currently smokes 1/4 ppd. Has quit in the past. Pre-contemplative. Offered medicines should he see fit. Will revisit at later. He has a high vascular disease burden.

## 2023-09-05 NOTE — Telephone Encounter (Signed)
 Copied from CRM [id number]. Topic: Clinical - Prescription Issue >> Sep 05, 2023 10:32 AM Corin V wrote: Reason for CRM: Patient called and stated that his empagliflozin (JARDIANCE) 10 MG TABS needed an authorization. >> Sep 05, 2023 10:42 AM Nurse Sherlean Foot wrote: Will froward to PA staff to complete

## 2023-09-05 NOTE — Progress Notes (Signed)
   CC: Chronic disease and ED followup  HPI:  Joshua [name] is a 53 y.o. male with a history stated below and presents today for checkup. Please see problem based assessment and plan for additional details.  Past Medical History:  Diagnosis Date   Anxiety    takes Hydroxyzine   Depression    has been on Effexor, not taking at this time   Diabetes mellitus without complication (HCC) 07/2016    Review of Systems: ROS negative except for what is noted on the assessment and plan.  Vitals:   09/05/23 1531 09/05/23 1604  BP: (!) 152/83 126/87  Pulse: 84 79  Temp: 98.4 F (36.9 C)   TempSrc: Oral   SpO2: 100%   Weight: 188 lb 6.4 oz (85.5 kg)   Height: 6' (1.829 m)     Physical Exam: Constitutional: well-appearing man in no acute distress HENT: normocephalic atraumatic, mucous membranes moist Cardiovascular: regular rate and rhythm, no m/r/g Pulmonary/Chest: normal work of breathing on room air, lungs clear to auscultation bilaterally Skin: warm and dry Psych: normal mood and behavior  Assessment & Plan:   Patient discussed with Dr. Antony Contras  Type 2 diabetes mellitus with hyperlipidemia (HCC) Visited the ED yesterday due to high blood glucose reading. He also noted increased urination but no other symptoms and not in DKA. He was out of all medicines x 2 months as his busy work schedule prevented office checkup and he had no refills. Today he feels well and without complaints. - A1c today is 11 after being off medicine x 2 months, actually an improvement from 7 months ago when he was above 14. This suggests that we can resume his prior regimen for now and further titrate as needed in 3 months. - Resume metformin 500 every day (limited due to GI upset) - Resume Jardiance 10 every day - ACR and foot exam today (foot exam stable) and eye ref - Eval in 3 months, could increase jardiance or add therapy  HTN (hypertension) Blood pressure on recheck 126/87. Would prefer  diastolic below 80 given diabetes. As he is restarting medicines, will not adjust regimen. - Continue amlodipine 10 qd  Hyperlipidemia associated with type 2 diabetes mellitus (HCC) Due for lipid panel. Restart atorvastatin. ASCVD risk above 20%. Primary prevention goal LDL below 100. - Resume atorvastatin 40 qd  Tobacco abuse Currently smokes 1/4 ppd. Has quit in the past. Pre-contemplative. Offered medicines should he see fit. Will revisit at later. He has a high vascular disease burden.  RTC in 3 months for HTN, A1c/T2DM, HLD. At that point can adjust diabetic medicines as indicated. Would consider tobacco cessation.  Katheran James, D.O. [facility] Internal Medicine, PGY-1 Phone: [phone] Date 09/05/2023 Time 4:35 PM

## 2023-09-06 ENCOUNTER — Telehealth: Payer: Self-pay | Admitting: *Deleted

## 2023-09-06 ENCOUNTER — Telehealth: Payer: Self-pay

## 2023-09-06 LAB — LIPID PANEL
Chol/HDL Ratio: 6.3 ratio — ABNORMAL HIGH (ref 0.0–5.0)
Cholesterol, Total: 150 mg/dL (ref 100–199)
HDL: 24 mg/dL — ABNORMAL LOW (ref >39)
LDL Chol Calc (NIH): 82 mg/dL (ref 0–99)
Triglycerides: 262 mg/dL — ABNORMAL HIGH (ref 0–149)
VLDL Cholesterol Cal: 44 mg/dL — ABNORMAL HIGH (ref 5–40)

## 2023-09-06 LAB — MICROALBUMIN / CREATININE URINE RATIO
Creatinine, Urine: 58.4 mg/dL
Microalb/Creat Ratio: 29 mg/g{creat} (ref 0–29)
Microalbumin, Urine: 17.2 ug/mL

## 2023-09-06 NOTE — Telephone Encounter (Signed)
 Communication  Reason for CRM: The patient states his pharmacy - [facility] is refusing to refill his [place], they advised a prior authorization is needed for insurance purposes before he can receive this medication. The patient states he has called multiple times regarding a prior authorization for his Jardiance and he was advised it was completed after his OV today. He needs this completed as soon as possible for his blood sugar levels.    Callback [phone]

## 2023-09-06 NOTE — Telephone Encounter (Signed)
 Copied from CRM [id number]. Topic: Clinical - Prescription Issue >> Sep 06, 2023  9:52 AM Hector Shade B wrote: Reason for CRM: [facility] needs a PA for the Jardiance: patient has requested a callback to update him on the PA for the medication.  I advised the patient that the nurse has the information regarding the PA just waiting for the provider to sign off on it.

## 2023-09-06 NOTE — Telephone Encounter (Signed)
 Prior Authorization for patient (Jardiance 10MG  tablets) came through on cover my meds was submitted with last office notes and labs awaiting approval or denial.  KEY:[id number]

## 2023-09-06 NOTE — Telephone Encounter (Signed)
 Rx was previously prescribed by a different provider- not with [facility], patient was seen yesterday Dr.Juberg wrote a new rx. Prior authorization will be submitted.

## 2023-09-07 ENCOUNTER — Encounter: Admitting: Student

## 2023-09-07 NOTE — Telephone Encounter (Signed)
 Prior authorization was submitted I am still waiting for a response from his insurance.

## 2023-09-07 NOTE — Telephone Encounter (Signed)
 SKIP LITKE (KeyRudene Christians) PA Case ID #: [id number] Need Help? Call us at [phone] Outcome Approved today by [facility] NCPDP 2017 Your PA request has been approved. Additional information will be provided in the approval communication. (Message [phone]) Effective Date: 09/07/2023 Authorization Expiration Date: 09/06/2024 Drug Jardiance 10MG  tablets ePA cloud logo Form [employer] Electronic PA Form [phone] NCPDP)  Patient is aware

## 2023-09-07 NOTE — Telephone Encounter (Signed)
 Copied from CRM [id number]. Topic: Clinical - Prescription Issue >> Sep 07, 2023 10:47 AM Everette Rank wrote: Reason for CRM: empagliflozin (JARDIANCE) 10 MG TABS tablet-Patient called in in update for pre Auth for medication due to he was int he hospital recently. Patient sent message on 03/17-03/19 needs update. Called office and tranferred >> Sep 07, 2023 10:58 AM Nurse Sherlean Foot wrote: Will forward to Amparo Bristol for PA results.

## 2023-09-09 ENCOUNTER — Encounter: Payer: Self-pay | Admitting: Student

## 2023-09-12 NOTE — Progress Notes (Signed)
 Internal Medicine Clinic Attending  Case discussed with the resident at the time of the visit.  We reviewed the resident's history and exam and pertinent patient test results.  I agree with the assessment, diagnosis, and plan of care documented in the resident's note.

## 2023-09-12 NOTE — Addendum Note (Signed)
 Addended by: Burnell Blanks on: 09/12/2023 02:09 PM   Modules accepted: Level of Service

## 2023-12-06 ENCOUNTER — Encounter: Admitting: Student

## 2023-12-06 NOTE — Progress Notes (Deleted)
 09/05/2023  Type 2 diabetes mellitus with hyperlipidemia (HCC) Visited the ED yesterday due to high blood glucose reading. He also noted increased urination but no other symptoms and not in DKA. He was out of all medicines x 2 months as his busy work schedule prevented office checkup and he had no refills. Today he feels well and without complaints. - A1c today is 11 after being off medicine x 2 months, actually an improvement from 7 months ago when he was above 14. This suggests that we can resume his prior regimen for now and further titrate as needed in 3 months. - Resume metformin  500 every day (limited due to GI upset) - Resume Jardiance  10 every day - ACR and foot exam today (foot exam stable) and eye ref - Eval in 3 months, could increase jardiance  or add therapy   HTN (hypertension) Blood pressure on recheck 126/87. Would prefer diastolic below 80 given diabetes. As he is restarting medicines, will not adjust regimen. - Continue amlodipine  10 qd   Hyperlipidemia associated with type 2 diabetes mellitus (HCC) Due for lipid panel. Restart atorvastatin . ASCVD risk above 20%. Primary prevention goal LDL below 100. - Resume atorvastatin  40 qd   Tobacco abuse Currently smokes 1/4 ppd. Has quit in the past. Pre-contemplative. Offered medicines should he see fit. Will revisit at later. He has a high vascular disease burden.   RTC in 3 months for HTN, A1c/T2DM, HLD. At that point can adjust diabetic medicines as indicated. Would consider tobacco cessation.  Current Outpatient Medications  Medication Instructions   ACCU-CHEK GUIDE TEST test strip See admin instructions   Accu-Chek Softclix Lancets lancets USE TO TEST BLOOD SUGAR UP TO FOUR TIMES DAILY AS DIRECTED   albuterol  (VENTOLIN  HFA) 108 (90 Base) MCG/ACT inhaler 1-2 puffs, Inhalation, Every 6 hours PRN   amLODipine  (NORVASC ) 10 mg, Oral, Daily   atorvastatin  (LIPITOR) 40 mg, Oral, Daily   benzonatate  (TESSALON ) 100 mg, Oral, 3 times  daily PRN   Blood Glucose Monitoring Suppl (ACCU-CHEK GUIDE ME) w/Device KIT Use to check blood sugar at least once daily   empagliflozin  (JARDIANCE ) 10 mg, Oral, Daily before breakfast   fluticasone  (FLONASE ) 50 MCG/ACT nasal spray 2 sprays, Each Nare, Daily   metFORMIN  (GLUCOPHAGE ) 500 mg, Oral, Daily   Multiple Vitamins-Minerals (MULTIVITAL PO) 1 tablet, Oral, Daily   omeprazole  (PRILOSEC) 20 mg, Oral, Daily      CC: {Clinic Visit Type:31040}  HPI:  Joshua [name] is a 53 y.o. male with pertinent PMH of *** who presents as above. Please see assessment and plan below for further details.  Medications: Current Outpatient Medications  Medication Instructions   ACCU-CHEK GUIDE TEST test strip See admin instructions   Accu-Chek Softclix Lancets lancets USE TO TEST BLOOD SUGAR UP TO FOUR TIMES DAILY AS DIRECTED   albuterol  (VENTOLIN  HFA) 108 (90 Base) MCG/ACT inhaler 1-2 puffs, Inhalation, Every 6 hours PRN   amLODipine  (NORVASC ) 10 mg, Oral, Daily   atorvastatin  (LIPITOR) 40 mg, Oral, Daily   benzonatate  (TESSALON ) 100 mg, Oral, 3 times daily PRN   Blood Glucose Monitoring Suppl (ACCU-CHEK GUIDE ME) w/Device KIT Use to check blood sugar at least once daily   empagliflozin  (JARDIANCE ) 10 mg, Oral, Daily before breakfast   fluticasone  (FLONASE ) 50 MCG/ACT nasal spray 2 sprays, Each Nare, Daily   metFORMIN  (GLUCOPHAGE ) 500 mg, Oral, Daily   Multiple Vitamins-Minerals (MULTIVITAL PO) 1 tablet, Oral, Daily   omeprazole  (PRILOSEC) 20 mg, Oral, Daily     Review  of Systems:   Pertinent items noted in HPI and/or A&P.  Physical Exam:  There were no vitals filed for this visit.  Constitutional:***. In no acute distress. HEENT: Normocephalic, atraumatic, Sclera non-icteric, PERRL, EOM intact Cardio:Regular rate and rhythm. 2+ bilateral {PulseLoc:28294} pulses. Pulm:Clear to auscultation bilaterally. Normal work of breathing on room air. Abdomen: Soft, non-tender, non-distended,  positive bowel sounds. ZOX:[id number] for extremity edema. Skin:Warm and dry. Neuro:Alert and oriented x3. No focal deficit noted. Psych:Pleasant mood and affect.   Assessment & Plan:   No problem-specific Assessment & Plan notes found for this encounter.    Patient {GC/GE:[id number]::discussed with,seen with} {JGIMTSattending2025/2026:32954}  Joshua Dallas, DO [facility] Internal Medicine Resident PGY-2 Clinic Phone: [phone] Please contact the on call pager at [phone] for any urgent or emergent needs.

## 2023-12-07 ENCOUNTER — Encounter: Payer: Self-pay | Admitting: Student

## 2024-03-14 ENCOUNTER — Telehealth: Payer: Self-pay

## 2024-03-14 ENCOUNTER — Other Ambulatory Visit: Payer: Self-pay

## 2024-03-14 ENCOUNTER — Encounter: Payer: Self-pay | Admitting: Student

## 2024-03-14 ENCOUNTER — Ambulatory Visit: Admitting: Student

## 2024-03-14 VITALS — BP 123/75 | HR 90 | Temp 98.2°F | Ht 72.0 in | Wt 178.0 lb

## 2024-03-14 DIAGNOSIS — Z720 Tobacco use: Secondary | ICD-10-CM

## 2024-03-14 DIAGNOSIS — E1169 Type 2 diabetes mellitus with other specified complication: Secondary | ICD-10-CM | POA: Diagnosis not present

## 2024-03-14 DIAGNOSIS — Z833 Family history of diabetes mellitus: Secondary | ICD-10-CM

## 2024-03-14 DIAGNOSIS — I10 Essential (primary) hypertension: Secondary | ICD-10-CM

## 2024-03-14 DIAGNOSIS — E119 Type 2 diabetes mellitus without complications: Secondary | ICD-10-CM

## 2024-03-14 DIAGNOSIS — Z79899 Other long term (current) drug therapy: Secondary | ICD-10-CM

## 2024-03-14 DIAGNOSIS — E785 Hyperlipidemia, unspecified: Secondary | ICD-10-CM

## 2024-03-14 DIAGNOSIS — Z7984 Long term (current) use of oral hypoglycemic drugs: Secondary | ICD-10-CM | POA: Diagnosis not present

## 2024-03-14 DIAGNOSIS — F17210 Nicotine dependence, cigarettes, uncomplicated: Secondary | ICD-10-CM

## 2024-03-14 LAB — POCT GLYCOSYLATED HEMOGLOBIN (HGB A1C): HbA1c, POC (controlled diabetic range): 10.9 % — AB (ref 0.0–7.0)

## 2024-03-14 LAB — GLUCOSE, CAPILLARY: Glucose-Capillary: 153 mg/dL — ABNORMAL HIGH (ref 70–99)

## 2024-03-14 MED ORDER — METFORMIN HCL 500 MG PO TABS: 500.0000 mg | ORAL_TABLET | Freq: Two times a day (BID) | ORAL | 3 refills | Status: AC

## 2024-03-14 MED ORDER — EMPAGLIFLOZIN 25 MG PO TABS: 25.0000 mg | ORAL_TABLET | Freq: Every day | ORAL | 3 refills | Status: DC

## 2024-03-14 MED ORDER — SEMAGLUTIDE(0.25 OR 0.5MG/DOS) 2 MG/3ML ~~LOC~~ SOPN
0.2500 mg | PEN_INJECTOR | SUBCUTANEOUS | 1 refills | Status: AC
  Filled 2024-03-14: qty 3, 56d supply, fill #0

## 2024-03-14 MED ORDER — AMLODIPINE BESYLATE 10 MG PO TABS: 10.0000 mg | ORAL_TABLET | Freq: Every day | ORAL | 3 refills | Status: AC

## 2024-03-14 MED ORDER — ATORVASTATIN CALCIUM 40 MG PO TABS: 40.0000 mg | ORAL_TABLET | Freq: Every day | ORAL | 3 refills | Status: AC

## 2024-03-14 MED ORDER — FLUTICASONE PROPIONATE 50 MCG/ACT NA SUSP: 1.0000 | Freq: Every day | NASAL | 11 refills | Status: AC

## 2024-03-14 NOTE — Progress Notes (Unsigned)
 CC: Routine Follow Up for diabetes and hypertension after last office visit 09/05/2023  HPI:  Joshua [name] is a 53 y.o. male with pertinent PMH of T2DM, HTN, HLD, and tobacco use disorder who presents as above. Please see assessment and plan below for further details.  Medications: Current Outpatient Medications  Medication Instructions   ACCU-CHEK GUIDE TEST test strip See admin instructions   Accu-Chek Softclix Lancets lancets USE TO TEST BLOOD SUGAR UP TO FOUR TIMES DAILY AS DIRECTED   amLODipine  (NORVASC ) 10 mg, Oral, Daily   atorvastatin  (LIPITOR) 40 mg, Oral, Daily   Blood Glucose Monitoring Suppl (ACCU-CHEK GUIDE ME) w/Device KIT Use to check blood sugar at least once daily   empagliflozin  (JARDIANCE ) 25 mg, Oral, Daily before breakfast   fluticasone  (FLONASE ) 50 MCG/ACT nasal spray 1 spray, Each Nare, Daily   metFORMIN  (GLUCOPHAGE ) 500 mg, Oral, 2 times daily with meals   Multiple Vitamins-Minerals (MULTIVITAL PO) 1 tablet, Oral, Daily   Semaglutide (0.25 or 0.5MG /DOS) 0.25 mg, Subcutaneous, Weekly     Review of Systems:   Pertinent items noted in HPI and/or A&P.  Physical Exam:  Vitals:   03/14/24 1031 03/14/24 1046 03/14/24 1114  BP: (!) 146/80 (!) 145/82 123/75  Pulse: 90 90 90  Temp: 98.2 F (36.8 C)    TempSrc: Oral    SpO2: 95%    Weight: 178 lb (80.7 kg)    Height: 6' (1.829 m)      Constitutional: Well-appearing adult male. In no acute distress. HEENT: Normocephalic, atraumatic, Sclera non-icteric, PERRL, EOM intact Cardio:Regular rate and rhythm. 2+ bilateral radial pulses. Pulm:Clear to auscultation bilaterally. Normal work of breathing on room air. Abdomen: Soft, non-tender, non-distended, positive bowel sounds. FDX:Wzhjupcz for extremity edema. Skin:Warm and dry. Neuro:Alert and oriented x3. No focal deficit noted. Psych:Pleasant mood and affect.   Assessment & Plan:   Assessment & Plan Primary hypertension Blood pressure continues to be  well-controlled at 123/75.  This is consistent with most of his home readings.  He is on amlodipine  monotherapy and very occasionally has mild orthostatic symptoms without worsening or falls.  Will preferentially add an ARB at some point but hold off due to the mild orthostatic symptoms, good blood pressure control, and lack of microalbuminuria last checked 08/2023. - Continue amlodipine  10 mg daily Type 2 diabetes mellitus with hyperlipidemia (HCC) A1c stable from prior at 10.9.  At his last visit about 6 months ago he had ran out of his medications for about 2 months.  At that visit he was resumed on metformin  500 mg daily and not told to increase due to prior GI upset.  Also resumed on Jardiance  10 mg daily due to prior success on these 2 medications.  Unfortunately did not follow-up 3 months after that.  He has been checking his blood sugar at home and has seen primarily 140s in the morning.  Urine microalbumin in 08/2023 was normal.  Since starting the metformin  he has not had any return of his GI upset.  Discussed need for improved glucose control and adding a GLP-1 agonist which he is agreeable to. - Start Ozempic  0.25 mg weekly and increase to 0.5 mg after 4 weeks - Titrate up metformin  every 4-5 days to a goal of 1000 mg twice daily - Increase Jardiance  to 25 mg daily - Return in 3 months for repeat A1c Hyperlipidemia associated with type 2 diabetes mellitus (HCC) Last lipid panel from 08/2023 shows LDL at goal of 82. - Continue atorvastatin  40  mg daily Tobacco abuse Continues to wean slowly off of cigarettes and was previously smoking 1/4 pack/day and is now down to 1 pack/week.  Counseled on the different options we do have including nicotine replacement, counseling, and medications.  He understands and will reach out if he would like to try any of these.  Orders Placed This Encounter  Procedures   Glucose, capillary   Basic metabolic panel with GFR   POCT glycosylated hemoglobin (Hb A1C)      Patient discussed with Dr. Ronnald Sergeant  Fairy Pool, DO [facility] Internal [facility] Resident PGY-3 Clinic Phone: [phone] Please contact the on call pager at [phone] for any urgent or emergent needs.

## 2024-03-14 NOTE — Telephone Encounter (Signed)
 Prior Authorization for patient (Ozempic  (0.25 or 0.5 MG/DOSE) 2MG /3ML pen-injectors) came through on cover my meds was submitted with last office notes and labs awaiting approval or denial.  [id number]

## 2024-03-14 NOTE — Patient Instructions (Signed)
 Thank you, [name], for allowing us  to provide your care today. Today we discussed . . .  > Diabetes       - Your A1c is a little bit better but it is still high so I would like to change some medications.  We are going to try to increase your metformin  with the goal to get to 1000 mg twice daily.  I would like you to start taking 500 mg twice daily for 4 to 5 days and then increase to 1000 mg in the morning and 500 mg in the evening for another 4 to 5 days before you increase to 1000 mg twice daily.  If you develop the GI symptoms at any of these doses just back down to the previous dose and let us  know where the highest tolerable dose is.  I would also like to increase your Jardiance  to 25 mg daily and start you on a once weekly injectable medication called Ozempic  (semaglutide ).  Will start at the lowest dose of 0.25 mg weekly and then after 4 weeks if you do not have any side effects such as nausea, vomiting, or stomach upset we can increase to 0.5 mg weekly.  We will see her back in 3 months to reevaluate and recheck her A1c. > Blood pressure       - I am not can to change her blood pressure medication but I would like you to bring in your blood pressure cuff to your next visit so we can check it against our blood pressure cuff here.  Please continue to check your blood pressure once or twice daily and write down the values in the blood pressure log I have given you to bring to your next visit.  I have ordered the following labs for you:   Lab Orders         Basic metabolic panel with GFR         Glucose, capillary         POCT glycosylated hemoglobin (Hb A1C)       Follow up: 3 months    Remember:  Should you have any questions or concerns please call the internal medicine clinic at [phone].     Fairy Pool, DO [facility]

## 2024-03-14 NOTE — Telephone Encounter (Addendum)
 Joshua [name] can you follow up on this request? I will be out tomorrow and Friday. Thank you!

## 2024-03-15 LAB — BASIC METABOLIC PANEL WITH GFR
BUN/Creatinine Ratio: 13 (ref 9–20)
BUN: 14 mg/dL (ref 6–24)
CO2: 21 mmol/L (ref 20–29)
Calcium: 9.8 mg/dL (ref 8.7–10.2)
Chloride: 102 mmol/L (ref 96–106)
Creatinine, Ser: 1.04 mg/dL (ref 0.76–1.27)
Glucose: 147 mg/dL — ABNORMAL HIGH (ref 70–99)
Potassium: 3.9 mmol/L (ref 3.5–5.2)
Sodium: 143 mmol/L (ref 134–144)
eGFR: 86 mL/min/1.73 (ref >59)

## 2024-03-15 NOTE — Telephone Encounter (Signed)
 Pharmacy Patient Advocate Encounter  Received notification from [facility] that Prior Authorization for OZEMPIC  0.25/0.5MG  has been APPROVED from 03/15/24 to 03/15/25   PA #/Case ID/Reference #: [id number]

## 2024-03-16 NOTE — Assessment & Plan Note (Signed)
 A1c stable from prior at 10.9.  At his last visit about 6 months ago he had ran out of his medications for about 2 months.  At that visit he was resumed on metformin  500 mg daily and not told to increase due to prior GI upset.  Also resumed on Jardiance  10 mg daily due to prior success on these 2 medications.  Unfortunately did not follow-up 3 months after that.  He has been checking his blood sugar at home and has seen primarily 140s in the morning.  Urine microalbumin in 08/2023 was normal.  Since starting the metformin  he has not had any return of his GI upset.  Discussed need for improved glucose control and adding a GLP-1 agonist which he is agreeable to. - Start Ozempic  0.25 mg weekly and increase to 0.5 mg after 4 weeks - Titrate up metformin  every 4-5 days to a goal of 1000 mg twice daily - Increase Jardiance  to 25 mg daily - Return in 3 months for repeat A1c

## 2024-03-16 NOTE — Assessment & Plan Note (Signed)
 Continues to wean slowly off of cigarettes and was previously smoking 1/4 pack/day and is now down to 1 pack/week.  Counseled on the different options we do have including nicotine replacement, counseling, and medications.  He understands and will reach out if he would like to try any of these.

## 2024-03-16 NOTE — Assessment & Plan Note (Signed)
 Last lipid panel from 08/2023 shows LDL at goal of 82. - Continue atorvastatin  40 mg daily

## 2024-03-16 NOTE — Progress Notes (Signed)
 Internal Medicine Clinic Attending  Case discussed with the resident at the time of the visit.  We reviewed the resident's history and exam and pertinent patient test results.  I agree with the assessment, diagnosis, and plan of care documented in the resident's note.

## 2024-03-16 NOTE — Assessment & Plan Note (Signed)
 Blood pressure continues to be well-controlled at 123/75.  This is consistent with most of his home readings.  He is on amlodipine  monotherapy and very occasionally has mild orthostatic symptoms without worsening or falls.  Will preferentially add an ARB at some point but hold off due to the mild orthostatic symptoms, good blood pressure control, and lack of microalbuminuria last checked 08/2023. - Continue amlodipine  10 mg daily

## 2024-03-19 ENCOUNTER — Other Ambulatory Visit: Payer: Self-pay

## 2024-03-19 NOTE — Telephone Encounter (Signed)
Thank you Camille! 

## 2024-03-20 ENCOUNTER — Other Ambulatory Visit: Payer: Self-pay

## 2024-03-22 LAB — OPHTHALMOLOGY REPORT-SCANNED

## 2024-03-28 ENCOUNTER — Other Ambulatory Visit: Payer: Self-pay

## 2024-03-29 ENCOUNTER — Other Ambulatory Visit: Payer: Self-pay

## 2024-07-25 ENCOUNTER — Other Ambulatory Visit: Payer: Self-pay

## 2024-07-25 DIAGNOSIS — E1169 Type 2 diabetes mellitus with other specified complication: Secondary | ICD-10-CM

## 2024-07-25 MED ORDER — EMPAGLIFLOZIN 25 MG PO TABS: 25.0000 mg | ORAL_TABLET | Freq: Every day | ORAL | 3 refills | Status: AC

## 2024-07-25 NOTE — Telephone Encounter (Signed)
 Unable to reach pt to sch an appt for follow-up visit, last appt with [facility] is on 03/14/2024. I left detailed message for pt to call back. Pharmacy is requesting refill, please reply back.
